# Patient Record
Sex: Male | Born: 1955 | Race: White | Hispanic: No | Marital: Married | Smoking: Never smoker
Health system: Southern US, Community
[De-identification: ages and names within clinical notes are randomized; demographics above are authoritative.]

## PROBLEM LIST (undated history)

## (undated) DIAGNOSIS — E66812 Obesity, class 2: Secondary | ICD-10-CM

## (undated) DIAGNOSIS — K219 Gastro-esophageal reflux disease without esophagitis: Secondary | ICD-10-CM

## (undated) DIAGNOSIS — G4733 Obstructive sleep apnea (adult) (pediatric): Secondary | ICD-10-CM

## (undated) DIAGNOSIS — I10 Essential (primary) hypertension: Secondary | ICD-10-CM

## (undated) DIAGNOSIS — E7800 Pure hypercholesterolemia, unspecified: Secondary | ICD-10-CM

## (undated) DIAGNOSIS — Z860101 Personal history of adenomatous and serrated colon polyps: Secondary | ICD-10-CM

## (undated) DIAGNOSIS — Z86010 Personal history of colonic polyps: Secondary | ICD-10-CM

## (undated) DIAGNOSIS — N529 Male erectile dysfunction, unspecified: Secondary | ICD-10-CM

## (undated) HISTORY — DX: Pure hypercholesterolemia, unspecified: E78.00

## (undated) HISTORY — DX: Obstructive sleep apnea (adult) (pediatric): G47.33

## (undated) HISTORY — DX: Personal history of adenomatous and serrated colon polyps: Z86.0101

## (undated) HISTORY — DX: Male erectile dysfunction, unspecified: N52.9

## (undated) HISTORY — DX: Obesity, class 2: E66.812

## (undated) HISTORY — PX: COLONOSCOPY: SHX174

## (undated) HISTORY — DX: Personal history of colonic polyps: Z86.010

## (undated) HISTORY — DX: Gastro-esophageal reflux disease without esophagitis: K21.9

## (undated) HISTORY — DX: Essential (primary) hypertension: I10

---

## 2003-06-22 DIAGNOSIS — K5792 Diverticulitis of intestine, part unspecified, without perforation or abscess without bleeding: Secondary | ICD-10-CM

## 2003-06-22 HISTORY — DX: Diverticulitis of intestine, part unspecified, without perforation or abscess without bleeding: K57.92

## 2006-06-21 HISTORY — PX: OTHER SURGICAL HISTORY: SHX169

## 2007-03-14 ENCOUNTER — Inpatient Hospital Stay (HOSPITAL_COMMUNITY): Admission: EM | Admit: 2007-03-14 | Discharge: 2007-03-18 | Payer: Self-pay | Admitting: Emergency Medicine

## 2007-04-17 ENCOUNTER — Ambulatory Visit: Payer: Self-pay | Admitting: Gastroenterology

## 2007-05-15 ENCOUNTER — Ambulatory Visit: Payer: Self-pay | Admitting: Gastroenterology

## 2007-05-25 ENCOUNTER — Inpatient Hospital Stay (HOSPITAL_COMMUNITY): Admission: RE | Admit: 2007-05-25 | Discharge: 2007-05-28 | Payer: Self-pay | Admitting: Surgery

## 2007-05-25 ENCOUNTER — Encounter (INDEPENDENT_AMBULATORY_CARE_PROVIDER_SITE_OTHER): Payer: Self-pay | Admitting: Surgery

## 2008-10-19 IMAGING — CT CT PELVIS W/ CM
2 of 5 series · 16 of 46 positions shown, 18 images · IV contrast (APPLIED)
Comparison: None

ABDOMEN CT WITH CONTRAST

CLINICAL DATA: Lower abdominal pain with nausea and fever.
TECHNIQUE: Multidetector CT imaging of the abdomen and pelvis was performed
following the standard protocol during bolus administration of intravenous
contrast.

Contrast:  100 cc Omnipaque 300

[Series 4: abd/pelv with 5.0 b31f st · axial · 0.75mm/px · z∈[-486,-56]mm · 13 of 98 slices shown, 15 images]
[im 6/98  soft-tissue]
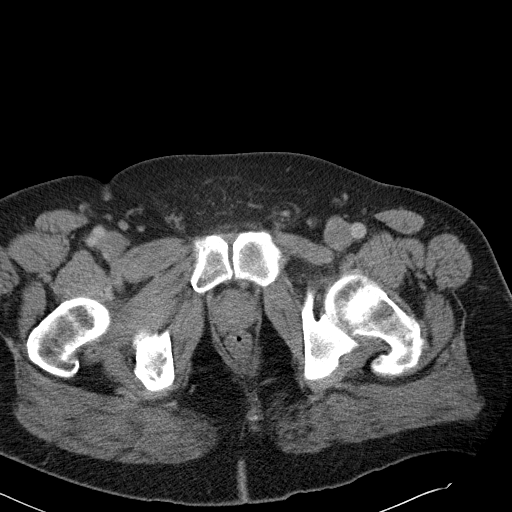
[im 6/98  bone]
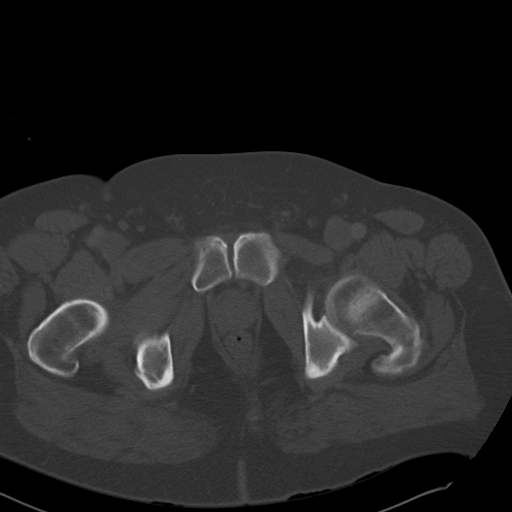
[im 11/98  soft-tissue]
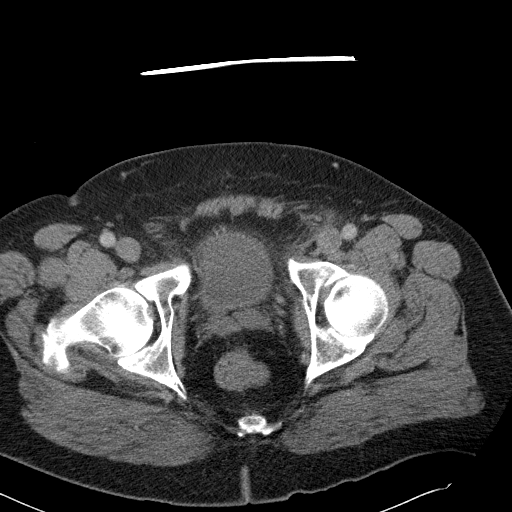
[im 22/98  soft-tissue]
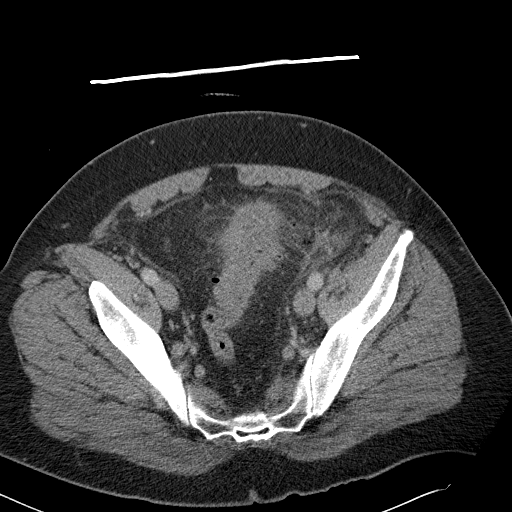
[im 27/98  soft-tissue]
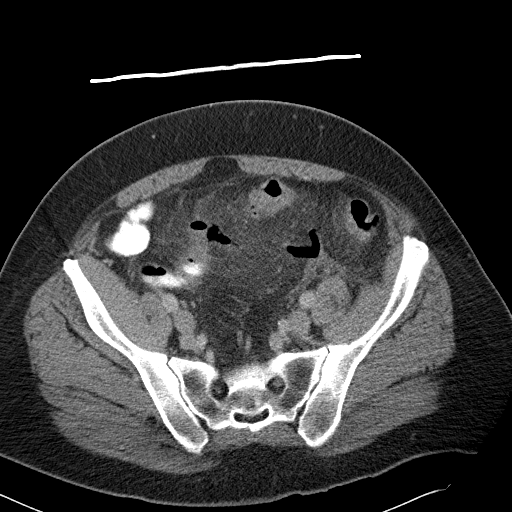
[im 33/98  soft-tissue]
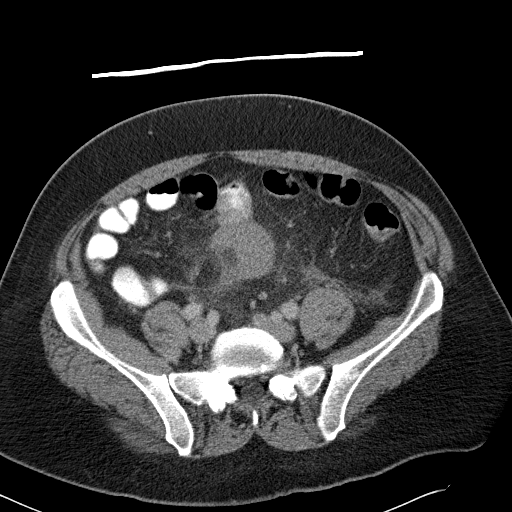
[im 44/98  soft-tissue]
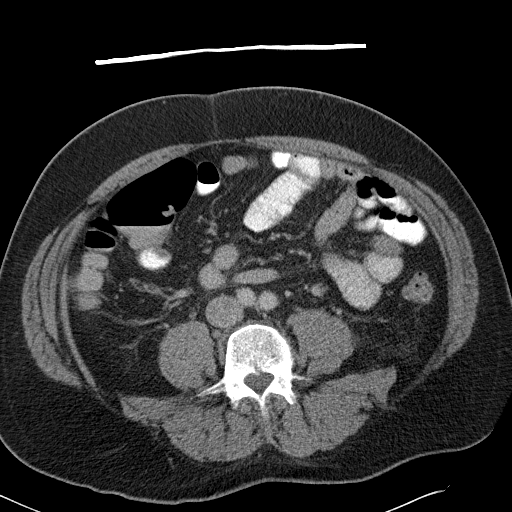
[im 49/98  soft-tissue]
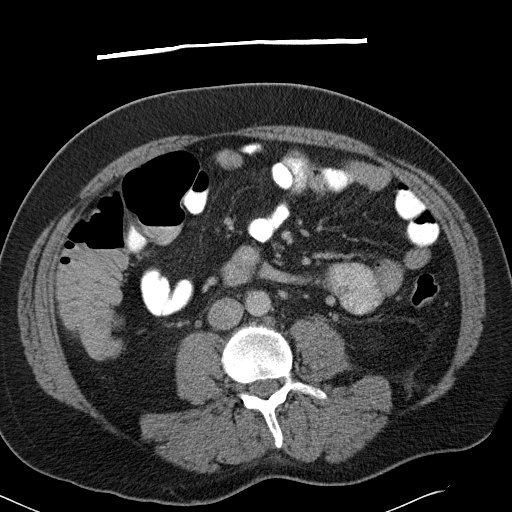
[im 54/98  soft-tissue]
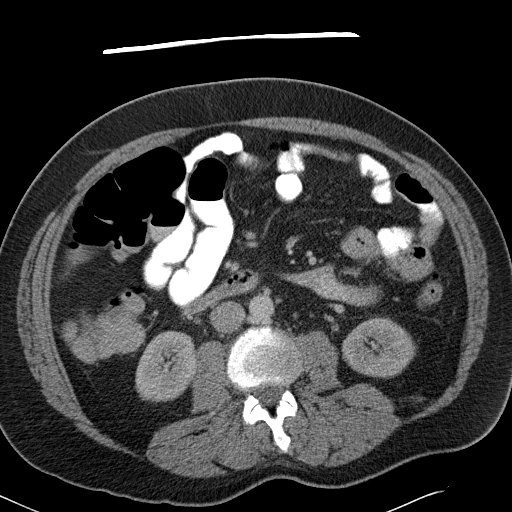
[im 65/98  soft-tissue]
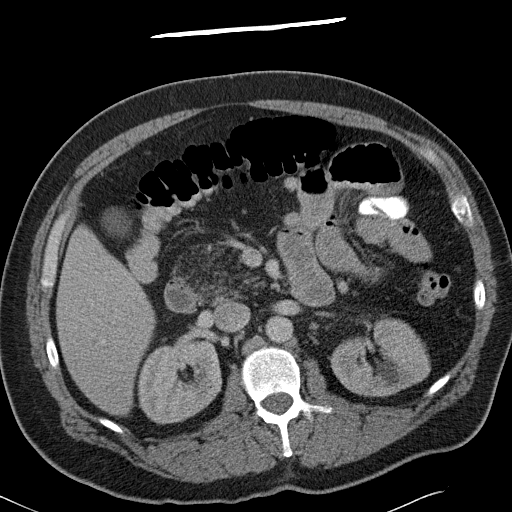
[im 65/98  bone]
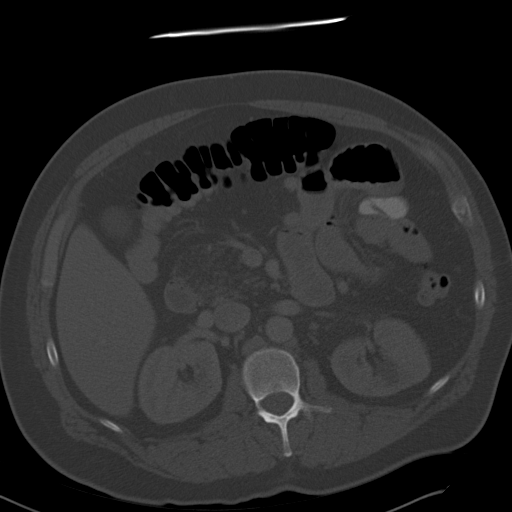
[im 71/98  soft-tissue]
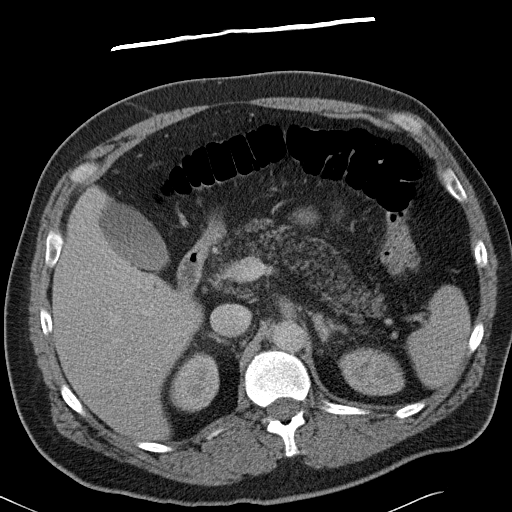
[im 76/98  soft-tissue]
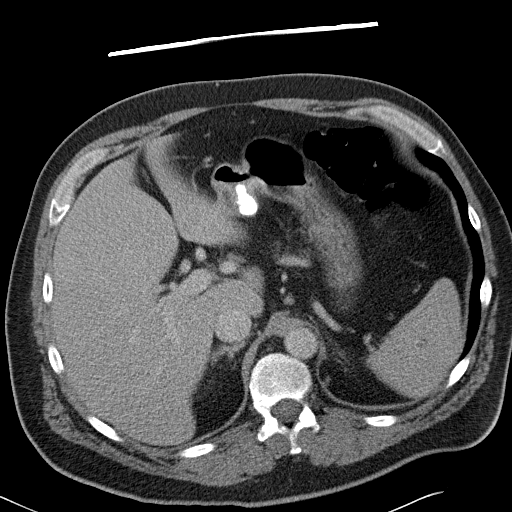
[im 87/98  soft-tissue]
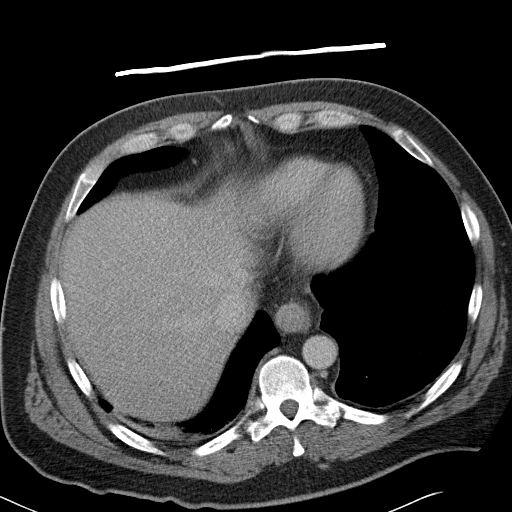
[im 92/98  soft-tissue]
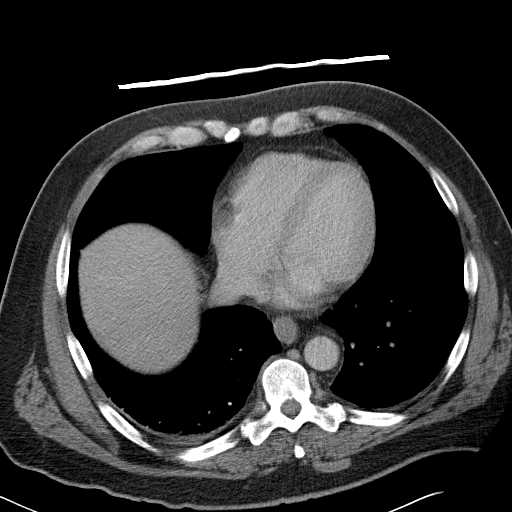

[Series 602: coronal abd · coronal · 0.95mm/px · 3 of 148 slices shown]
[im 50/148  soft-tissue]
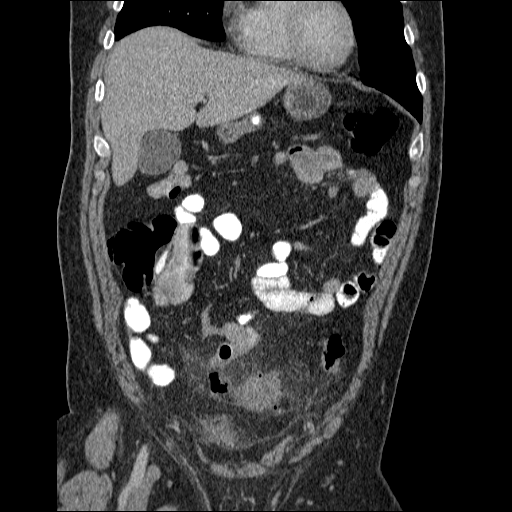
[im 66/148  soft-tissue]
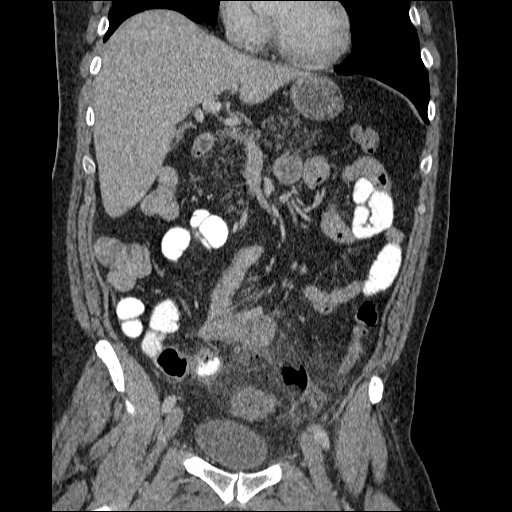
[im 82/148  soft-tissue]
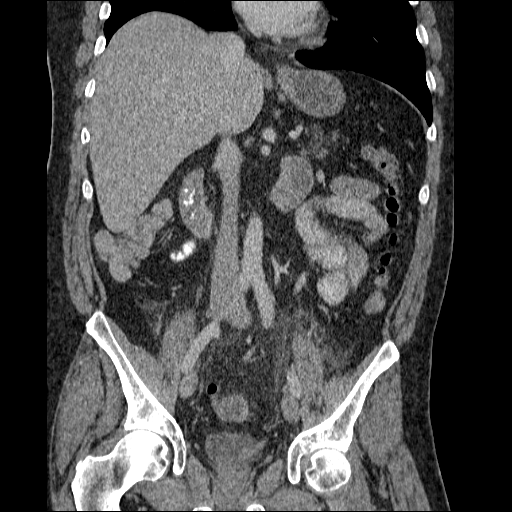

[16 of 46 positions shown; findings below may reference images not displayed]

FINDINGS: Dependent subsegmental atelectasis is present in the right lower
lobe. The left lung base appears clear.

A small hiatal hernia is present. A 3 mm hypodensity in the right hepatic lobe
on image 33 of series 4 is technically nonspecific but small size, and a similar
lesion is present on image 39 of series 4. These lesions are statistically
likely to be benign.

The spleen appears unremarkable. The adrenal glands and gallbladder appear
normal. There is prominent fatty atrophy of the pancreas. There are air-fluid
levels in several nondilated loops of small bowel in the upper abdomen, possibly
representing ileus.

Left-sided hypodense renal lesions are present and are most compatible with
cysts.

IMPRESSION

1. Small hiatal hernia.
2. Air-fluid levels in several nondilated loops of small bowel, nonspecific but
likely representing ileus.

PELVIS CT WITH CONTRAST
FINDINGS: Perforated diverticulitis is noted with scattered free
intraperitoneal gas along the inflamed sigmoid colon, with prominent surrounding
mesenteric stranding. I cannot exclude a small amount of localized portal venous
gas, although this is considered less likely. 

There is some secondary inflammation of a loop of distal ileum extending
adjacent to the extraluminal gas and inflammation, as shown on image 61 and
series 602 and image 69 series 4. In addition, there are several matted loops of
unopacified small bowel centrally just below the level of the iliac crest which
are adjacent to this inflammation and which are indistinct in appearance;
secondary inflammation of the small bowel loops is suspected. Ischemia of the
small bowel loops is difficult to exclude given the matted appearance, but
overall I believe that the source of the free intraperitoneal gas is the sigmoid
diverticulitis. The terminal ileum does appear to extend around a mesenteric
vessel or the appendix on consecutive images 55 through 59 series 4.

Inflammatory stranding extends along the left psoas muscle and into the left
inguinal region. The urinary bladder appears unremarkable.

IMPRESSION

1. Perforated sigmoid diverticulitis, with surrounding collections of free
intraperitoneal gas, and with secondary inflammation of adjacent loops of small
bowel. Given a highly indistinct appearance of the sigmoid colon, a perforated
sigmoid colon tumor cannot be totally excluded, although diverticulitis is
considered more likely.

## 2010-11-03 NOTE — Op Note (Signed)
Matthew Hess, GANGE NO.:  [id number]   MEDICAL RECORD NO.:  [id number]          PATIENT TYPE:  INP   LOCATION:  0003                         FACILITY:  [facility]   PHYSICIAN:  Ardeth Sportsman, MD     DATE OF BIRTH:  Oct 31, 1955   DATE OF PROCEDURE:  05/25/2007  DATE OF DISCHARGE:                               OPERATIVE REPORT   GASTROENTEROLOGIST:  Rachael Fee, MD   SURGEON:  Ardeth Sportsman, MD   ASSISTANT:  Clovis Pu. Cornett, MD   DIAGNOSIS:  Diverticulosis with severe sigmoid diverticular attack,  status post intravenous antibiotics.   POSTOPERATIVE DIAGNOSES:  1. Diverticulosis with severe sigmoid diverticular attack, status post      intravenous antibiotics.  2. Small bowel and appendiceal adhesions.   PROCEDURES PERFORMED:  1. Laparoscopic lysis of adhesions x60 minutes, which is about 40% the      case.  2. Laparoscopic appendectomy.  3. Laparoscopically-assisted sigmoid colectomy with 33 EEA      anastomosis.   ANESTHESIA:  1. General anesthesia.  2. Local anesthetic in a field block around port sites and extraction      site.   SPECIMENS:  1. Sigmoid colon.  Open end is proximal.  There are two anastomotic      rings.  The Prolene stitch is in the proximal ring.  2. Appendix adherent to the sigmoid colon diverticular disease.   ESTIMATED BLOOD LOSS:  250 mL.   DRAINS:  None.   COMPLICATIONS:  None apparent.   INDICATIONS:  Matthew Hess is a 55 year old male who had a severe episode  of diverticulitis with perforation that required a significant hospital  stay and IV antibiotics and long-term oral antibiotics.  He eventually  improved over a period of weeks.  Because of his significant perforation  and his relatively young age and busy lifestyle, options are discussed  including antibiotics again if re-perforation versus surgery.  The  technique of laparoscopically-assisted versus possible open sigmoid  colectomy with anastomosis  discussed.  Risks such as stroke, heart  attack, deep venous thrombosis, pulmonary embolism and death discussed.  The risks such as bleeding, transfusion, wound infection, abscess,  injury to other organs, incisional hernia, prolonged pain, anastomotic  leak resulting in need for colostomy, reoperation, ureteral injury and  other risks were discussed.  Options were discussed and the patient  wished to proceed to have a surgical approach to prevent further attacks  since this was a very severe episode and he is a very busy man that  travels and is relatively young and healthy.  The patient received a  colonoscopy by Dr. Wendall Papa, which proved no other intra-abdominal  pathologies.  Therefore, the patient wished to agree to proceed with  surgery.   OPERATIVE FINDINGS:  He had a very thickened but short segment of  sigmoid colon with a dense, thickened mesentery inflammation.  He had  dense adhesions to his anterior abdominal wall as well.  He had least  two loops of small bowel that were densely adherent to the sigmoid  mesentery and sigmoid itself.  The tip  of the appendix was densely  adherent to this area as well.  He had some moderate greater omental  adhesions as well.  The rest of the abdomen was unremarkable.   DESCRIPTION OF PROCEDURE:  Informed consent was confirmed.  The patient  had received perioperative bowel prep.  He had a preoperative alvimopan  prior to surgery.  He had sequential compression devices active during  the entire case.  He received preoperative IV Invanz.  He underwent  general anesthesia without difficulty.  He was positioned in low  lithotomy with arms tucked.  A Foley catheter was sterilely placed.  His  abdomen was clipped, prepped and draped in sterile fashion.   Entry was gained to the patient through placement of a 5-mm port in the  right upper quadrant with the patient in steep reverse Trendelenburg and  right side up using optical entry technique.   A capnoperitoneum to 15  mmHg provided good abdominal insufflation.  Under direct visualization,  5-mm ports placed in the right flank and left flank through the  umbilicus.  A 12 port was placed just right of midline in the suprapubic  region.   The findings were as noted above with some dense adhesions.  The patient  was positioned head down and left side up.  The small bowel was  reflected away except that he had some dense, adherent loops of small  bowel to the sigmoid colon.  Cold sharp dissection was used to free the  small bowel off these areas.  A careful inspection was made on the two  loops that were densely adherent and there was no evidence of serosal  tear or enterotomy.  He also had the tip of the appendix adherent to  this area.  This was densely adherent at the appendix, and I and Dr.  Luisa Hart agreed that appendectomy was warranted.  Therefore, a window was  made in the appendiceal mesentery at the base and the appendix was  stapled off, taking a cuff of normal cecum.  The appendiceal mesentery  was taken with LigaSure.   Attention was turned towards the initial mediolateral approach with the  sigmoid colon.  The superior hemorrhoidal arteries and inferior  mesenteric artery could easily be seen the mesentery since it was  already tented up.  The mesentery was scored on the right side and just  inferior to this mesentery and I was able to get into the  retromesenteric plane (Toldt's fascia).  The sigmoid mesentery was  elevated to the anterior abdominal wall and was able to free up the  retromesenteric attachment in a to the mediolateral fascia.  The iliac  arteries could be easily seen.  Initially it was hard to find the ureter  but on going up further on the pelvic brim, we could see a ureter in a  less inflamed area, I was able to follow it down, and it was kept  retroperitoneal and preserved at all times.  With the ureter identified,  I went in and took the  mesentery of the sigmoid colon about halfway  between the origin of the IMA to the sigmoid colon, sparing the left  colic artery.  It was taken using the LigaSure to the junction of the  descending/sigmoid colon to good result.  Further retromesenteric  dissection was done, carried cephalad up towards the splenic flexure and  the distal transverse colon.  There were some dense greater omental  adhesions to the lateral sidewall and these were freed  up to allow the  greater omentum to further reflect out of the way.   Mesentery was scored more distally to the right side of the rectum until  we came down to a few centimeters proximal to the peritoneal reflection  where the tinea had spread out to an obvious sigmoid-rectal junction.  The bowel wall was normal and not thickened or inflamed.  The mesentery  seemed normal as well.   The colon was medialized in a lateral to medial fashion, taking first  cold sharp scissors to help free the descending colon off the lateral  side attachments in a normal region and follow it down inferiorly down  towards the palpable reflection.  I was able to match up with the  retromesenteric dissection and identify and preserve the left ureter at  all times.  Eventually I was able to swing over and come over the pelvic  brim carefully and isolate sidewall attachments.   Given the pretty good mobilization we had, I decided to finally take the  sigmoid colon at the distal end.  A window was made at the  sigmoid/rectal junction and a window was made between the mesentery and  the proximal rectum at the sigmoid/rectal junction.  The colon was  stapled at this region using laparoscopic stapler 45 x2.  Most of it was  able to do with one firing and just needed an extra firing to get the  final 5 mm of a corner of the rectum.  The remaining rectal and distal  sigmoid mesentery was taken using the LigaSure, staying close to the  colon to avoid any retroperitoneal  injury.  With this done, we had  excellent mobilization.  There were some small bleeders on the rectal  mesentery as well as a little bit of the sigmoid mesentery, and these  were isolated and controlled with LigaSure to good result.  An  irrigation of over a liter was done with clear return.   We then decided to exteriorize.  Capnoperitoneum was evacuated through  the ports.  A 6-cm Pfannenstiel incision was made from the 12-mm  suprapubic port.  The anterior rectus fascia was opened up transversely  and subfascial planes were created superiorly and inferiorly.  The  peritoneum was entered between the linea alba.  I was able to get  through the peritoneum as well.  A wound protector was placed and the  colon was able to be easily exteriorized until we got to good, healthy  tissue at the descending/sigmoid junction.  There seemed to be plenty of  mobilization and splenic flexure mobilization was not required.  A  window was made in the normal descending colon and a soft bowel clamp  was made proximally a Kocher was made distally and the colon was  transected.  The remaining mesentery attachments were freed off using  the LigaSure, staying close to the colon to preserve as much blood  supply as possible.  A specimen of the colon was sent off.   EEA sizers were used and the colon easily allowed a 33 EEA sizer, and  therefore used a 33 EEA stapler.  Prolene 2-0 stitch was used in a  pursestring fashion to help tighten the colon around the anvil.  There  were some large epiploic appendages that were freed off using a LigaSure  and cautery, but we made sure to avoid over-skeletonization.  There had  been bleeding at the transection area, so blood supply was excellent.  Inspection was made  on the on the mesentery and hemostasis was good.  The descending colon was allowed to fall back into the abdomen.  The  wound protector was twisted off to clamp it, and capnoperitoneum was   reintroduced.   Dr. Luisa Hart went down below and did gentle finger and EEA sizer dilation  of the rectal stump.  A 33 EEA stapler easily came up the rectal stump,  and the spike was brought out through the middle of the staple line of  the rectal stump.  The anvil of the descending colon was attached onto  the colon.  There was no twisting or turning of the mesentery and it  laid well.  The staple was locked, down held for 30 seconds, fired, and  held while fired for 30 seconds and then removed.  There were two  excellent anastomotic rings and these were sent with the specimen (the  proximal end has a Prolene stitch).  Rigid proctoscopy was performed by  Dr. Luisa Hart with no bleeding within the lumen and the anastomosis was  intact.  I clamped the descending colon proximal to the anastomosis as  gentle insufflation was done under water and there was no leaking of  bubbles with good insufflation, arguing that there is no evidence of  anastomotic leak.   Copious irrigation of another 3 L was done down in the pelvis and sides  of the abdomen.  Diagnostic laparoscopy revealed no intra-abdominal  injury.  Again the small bowel and the cecal cuff and the appendiceal  mesentery were intact with no bleeding or leak.  The small bowel was  viable with no evidence of any serosal tears or leak.  Capnoperitoneum  was evacuated through the ports.  The Pfannenstiel incision was closed  in the peritoneum using a 3-0 Vicryl running stitch.  The fascia was  closed using #1 PDS in running fashion, transverse fashion.  The skin at  this incision and the port sites was closed using 4-0 Monocryl stitch.  Note that I irrigated a liter in the preperitoneal plane and then in the  subcutaneous plane and the Pfannenstiel incision each of warm saline to  help clear out the area.  Hemostasis was excellent.  Bacitracin ointment was placed over  Pfannenstiel incision and sterile dressings were applied.  The patient   was extubated and sent to recovery in stable condition.  I explained the  findings to the patient's family.  Postop instructions were discussed  and they expressed appreciation.      Ardeth Sportsman, MD  Electronically Signed     SCG/MEDQ  D:  05/25/2007  T:  05/25/2007  Job:  [id number]   cc:   Rachael Fee, MD  [address]  [place], [place] [address]

## 2010-11-03 NOTE — H&P (Signed)
NAMEDAESHON, GRAMMATICO             ACCOUNT NO.:  [id number]   MEDICAL RECORD NO.:  [id number]          PATIENT TYPE:  INP   LOCATION:  5727                         FACILITY:  [facility]   PHYSICIAN:  Ardeth Sportsman, MD     DATE OF BIRTH:  [birth year]   DATE OF ADMISSION:  03/14/2007  DATE OF DISCHARGE:                              HISTORY & PHYSICAL   PRIMARY CARE PHYSICIAN:  None   REQUESTING PHYSICIAN:  Cheri Guppy at [facility]   SURGEON:  Karie Soda for the [facility]   CHIEF COMPLAINT:  Severe abdominal pain and bloating, probable  perforated diverticulitis.   HISTORY OF PRESENT ILLNESS:  Mr. Reifsteck is a 55 year old male who has  shun doctors and otherwise claims to be in pretty good health, who noted  some severe abdominal pain that has been going on for the past few days  and it is rather generalized.  He has had some nausea, but not actually  throwing up.  He normally eats rather well, but has been having poor  appetite and been just switching over to liquids.  He was trying to ride  it out, but it continued to worsen and it got very severe last night.  He came to the emergency room a few hours ago.  Evaluation was done  concerning for perforated diverticulitis and we were asked to admit the  patient.   He has never had a colonoscopy done.  Denies any history of hematochezia  or melena or hematemesis.   PAST MEDICAL HISTORY:  Questionable obstructive sleep apnea or at least  snoring.   PAST SURGICAL HISTORY:  UP3, uvulopalatopharyngoplasty for obstructive  sleep apnea in the distant past.   ALLERGIES:  None.   MEDICATIONS:  None.   SOCIAL HISTORY:  He has never smoked.  I do like to drink my beer,  ?six-pack a beer a week.  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   FAMILY HISTORY:  Otherwise negative.   REVIEW OF SYSTEMS:  As noted per HPI.  CONSTITUTIONAL:   He has had some  chills, but no recent weight gain or weight loss.  EYES/ENT/CARDIAC/RESPIRATORY/HEPATIC/RENAL/ENDOCRINE/MUSCULOSKELETAL/NEU  ROLOGICAL/PSYCH/HEME/LYMPH/ALLERGIC:  Otherwise negative.  BREASTS:  Negative.  SKIN:  Negative.  OPHTHALMOLOGY:  Negative.   VITAL SIGNS:  He temperature is 97.4, pulse initially 103, currently 81,  respirations 18, 6/10 pain, 96% sat on room air.  GENERAL:  He is a well-developed, well-nourished overweight male talking  on his cell phone and a little anxious, but not in major distress.  PSYCH:  He is of at least average intelligence and pretty good insight  with no evidence of dementia, delirium, psychosis, paranoia.  EYES:  Pupils equal, round and reactive to light.  Extraocular movements  intact.  Sclerae not icteric or injected.  HEENT:  Normocephalic.  Mucous membranes are moist.  Nasopharynx and  oropharynx clear.  He has pretty good dentition.  NECK:  Supple.  No masses.  Trachea is midline.  HEART:  Regular rate and  rhythm.  No murmurs, gallops or rubs.  CHEST:  Clear to auscultation bilaterally.  No wheezes, rales or  rhonchi.  No pain to rib or sternal compression.  ABDOMEN:  Obese, but soft, may be slightly distended.  He is very tender  in his left lower quadrant, but the rest of his abdomen is soft and not  as particularly tender.  He has some periumbilical discomfort, but no  umbilical hernia.  GENITAL:  Normal external mal genitalia.  Testes descended bilaterally.  No inguinal hernia.  RECTAL:  Deferred per patient's request.  EXTREMITIES:  No clubbing, cyanosis or edema.  MUSCULOSKELETAL:  Full range of motion shoulders, elbows, wrists as well  as hip, knees, ankles.  NEURO:  Cranial nerves II-XII are intact.  Hand grip is 5/5, equal and  symmetrical.  No resting or intention tremors.  SKIN:  No obvious petechia material.  No other sores or lesions.   STUDIES:  Urinalysis is negative.  White count is 15.5.  He does have a   left shift.  His potassium is 3.1, creatinine is 1.16.   He does have an x-ray that shows a questionable small hiatal hernia with  some air fluid levels.   CT scan of the abdomen and pelvis shows in his mid-sigmoid colon an  inflamed loop with diverticulosis, and he has a fair amount of pockets  of air within his mesentery and some pockets of free air although it is  not massively diffuse.   ASSESSMENT/PLAN:  A 55 year old gentleman with perforated diverticulitis  that seems somewhat contained down into his pelvis with localized  peritonitis.   1. Admit.  2. IV fluids.  3. NPO.  4. IV antibiotics.  5. Serial exams.   Anatomy and physiology of the digestive tract were explained;  pathophysiology of diverticulitis with its natural history was  discussed.  Recommendations were made to admit and place on IV  antibiotics to see if this will cool down on its own; will follow up  scans and possible draining if he develops abscesses.  If he has  hemodynamically instability or worsening pain or worsening white count,  then he will require emergent colectomy with probable colostomy.  I  would like to try and see if we can get this to cool down and switch  over to oral antibiotics, do a colonoscopy in six weeks, and plan a  segmental colonic resection if there are no other surprises.   He seemed a little frustrated about having to be admitted right now, but  he was compliant with our recommendations and we will work to follow him  closely and see if he can avoid emergency surgery although I am concern  given fair amount of dots of pneumoperitoneum.      Ardeth Sportsman, MD  Electronically Signed     SCG/MEDQ  D:  03/14/2007  T:  03/14/2007  Job:  [id number]

## 2010-11-06 NOTE — Discharge Summary (Signed)
Matthew Hess, Matthew Hess             ACCOUNT NO.:  [id number]   MEDICAL RECORD NO.:  [id number]          PATIENT TYPE:  INP   LOCATION:  5704                         FACILITY:  [facility]   PHYSICIAN:  Wilmon Arms. Corliss Skains, M.D. DATE OF BIRTH:  09-21-1955   DATE OF ADMISSION:  03/14/2007  DATE OF DISCHARGE:  03/18/2007                               DISCHARGE SUMMARY   CHIEF COMPLAINT/REASON FOR ADMISSION:  Matthew Hess is a 51-year male  patient who normally does not follow up with physicians on a regular  basis, has no chronic medical problems, presented to the ER with severe  abdominal pain.  He was afebrile on presentation.  On abdominal exam he  was very tender in the left lower quadrant.  He had a white count of  15,500.  A CT scan was performed that demonstrated mid sigmoid colon  inflamed loop compatible with diverticulosis with pockets of air within  the mesentery and some free air consistent with a microperforation.  The  patient was admitted with a diagnosis of acute diverticulitis with  possible microperforation and contained peritonitis.   HOSPITAL COURSE:  The patient was admitted, placed on n.p.o. status, IV  fluid hydration and IV antibiotics/cefoxitin.  Hospital day #1, the  patient was reportedly feeling better than prior to admission except his  stomach felt bloated.  His white count had decreased to 10,500.  On exam  he was still tender in the left lower quadrant but bowel sounds were  present.  He was started on a clear liquid diet and continued on IV  antibiotics and Toradol was added for pain management.  Over the next  several days the patient continued to do well.  His white count  continued to trend down.  He remained afebrile.  His pain continued to  decrease.  CT scan was repeated on March 17, 2007.  This  demonstrated a stable contained perforation without any identifiable  drainable abscess.  The patient's diet continued to be advanced and on  March 18, 2007,  the patient was tolerating a solid diet with minimal  left lower quadrant tenderness and was deemed appropriate for discharge  home on p.o. antibiotics with plans to follow up with Dr. Michaell Cowing in the  outpatient setting.  The patient apparently has a trip planned for  [place] next week to go to work.  He had been instructed on multiple  occasions by Dr. Michaell Cowing as well as the PA student and again by Dr. Corliss Skains  about not flying given he has had a contained perforation and the change  in cabin pressure may affect this, but the patient apparently is  planning on making this trip after discharge.   FINAL DISCHARGE DIAGNOSES:  Abdominal pain and leukocytosis secondary to  acute diverticulitis with microperforation.   DISCHARGE MEDICATIONS:  1. Metoprolol 25 mg b.i.d. - this is new for new-onset hypertension.  2. Percocet 5/325 one to two tablets every 4 hours as need for pain.  3. Cipro 500 mg b.i.d. for 14 days.  4. Flagyl 500 mg t.i.d. for 14 days.  5. Over-the-counter ibuprofen two to three  tablets every 8 hours as      needed for pain.  Please take with food or snack.   RETURN TO WORK:  The patient is self-employed.   DIET:  Low-sodium heart-healthy.   WOUND CARE:  Not applicable.   ACTIVITY:  Increase activity slowly.  May walk up steps.  May shower.  No driving while taking the Percocet.   OTHER INSTRUCTIONS:  Do not fly for the next 2 weeks.  Also, you are to  call the doctor if (a) fever greater than or equal to 100 degrees  Fahrenheit; (b) new or increased belly pain; (c) diarrhea, nausea or  vomiting; or (d) inability to pass stool.      Allison L. Rondel Jumbo. Tsuei, M.D.  Electronically Signed    ALE/MEDQ  D:  04/27/2007  T:  04/28/2007  Job:  [id number]   cc:   Ardeth Sportsman, MD

## 2010-11-06 NOTE — Discharge Summary (Signed)
Matthew Hess, Matthew Hess             ACCOUNT NO.:  [id number]   MEDICAL RECORD NO.:  [id number]          PATIENT TYPE:  INP   LOCATION:  1534                         FACILITY:  [facility]   PHYSICIAN:  Ardeth Sportsman, MD     DATE OF BIRTH:  23-Dec-1955   DATE OF ADMISSION:  05/25/2007  DATE OF DISCHARGE:  05/28/2007                               DISCHARGE SUMMARY   FINAL DIAGNOSES:  Recurrent diverticulitis with diverticulosis.   PROCEDURE PERFORMED:  Laparoscopic lyses of adhesions, appendectomy and  sigmoid colectomy with anastomosis on May 25, 2007.   HOSPITAL COURSE:  Matthew Hess is a 55 year old gentleman who had severe  episode of diverticulitis with perforation and recurrent pain who was  felt to benefit from resection.  This was done laparoscopically on  May 25, 2007.  He was placed on oral enteric.  He began to have  flatus and by the time of discharge was tolerating a solid diet.  He had  a bowel movement.  His pain was well-controlled on oral medications.  He  was walking in the hallways.  The patient is improved slightly and felt  to be safe for discharge on postop day three 3 with the following  instructions:  1. He is return to clinic to see me in a couple weeks.  2. He is to follow up on pathology.  3. He should call if he has any fevers, chills or sweats, nausea,      vomiting, drainage from incision, uncontrolled pain or other      issues.  4. He should be discharged with Percocet 1-2 p.o. q.4h. p.r.n. pain,      ibuprofen 400-800 mg p.o. q.6h. p.r.n. pain, ice packs or heating      pads p.r.n. pain, Tylenol p.r.n. pain.      Ardeth Sportsman, MD  Electronically Signed     SCG/MEDQ  D:  06/23/2007  T:  06/23/2007  Job:  [id number]   cc:   Rachael Fee, MD  [address]  [place], [place] [address]

## 2011-03-29 LAB — CBC
HCT: 42.8
Hemoglobin: 14.4
MCHC: 33.6
Platelets: 376
RDW: 14.5

## 2011-03-29 LAB — BASIC METABOLIC PANEL
BUN: 5 — ABNORMAL LOW
CO2: 27
Calcium: 9.1
GFR calc non Af Amer: >60
Glucose, Bld: 95
Potassium: 4.1

## 2011-04-01 LAB — CBC
HCT: 32.6 — ABNORMAL LOW
HCT: 32.8 — ABNORMAL LOW
HCT: 37.8 — ABNORMAL LOW
Hemoglobin: 10.9 — ABNORMAL LOW
Hemoglobin: 11 — ABNORMAL LOW
Hemoglobin: 12.7 — ABNORMAL LOW
MCHC: 33.6
MCV: 86.4
MCV: 86.4
MCV: 86.5
Platelets: 292
Platelets: 341
RBC: 4.37
RDW: 13.2
RDW: 13.2
RDW: 13.4
WBC: 6.4
WBC: 8.1

## 2011-04-01 LAB — DIFFERENTIAL
Basophils Absolute: 0
Basophils Absolute: 0.1
Basophils Relative: 0
Eosinophils Relative: 0
Eosinophils Relative: 3
Lymphocytes Relative: 17
Lymphocytes Relative: 22
Lymphs Abs: 1.4
Lymphs Abs: 1.4
Monocytes Absolute: 0.8 — ABNORMAL HIGH
Monocytes Absolute: 0.8 — ABNORMAL HIGH
Monocytes Relative: 12 — ABNORMAL HIGH
Monocytes Relative: 5
Neutro Abs: 13.6 — ABNORMAL HIGH
Neutro Abs: 5.8

## 2011-04-01 LAB — BASIC METABOLIC PANEL
BUN: 12
CO2: 24
Calcium: 8.2 — ABNORMAL LOW
Chloride: 101
Creatinine, Ser: 0.85
GFR calc Af Amer: >60
GFR calc non Af Amer: >60
Glucose, Bld: 124 — ABNORMAL HIGH
Glucose, Bld: 145 — ABNORMAL HIGH
Potassium: 3.1 — ABNORMAL LOW
Potassium: 3.6
Sodium: 133 — ABNORMAL LOW

## 2011-04-01 LAB — URINALYSIS, ROUTINE W REFLEX MICROSCOPIC
Nitrite: NEGATIVE
Protein, ur: 100 — AB
Specific Gravity, Urine: 1.022
Urobilinogen, UA: 1

## 2011-04-01 LAB — URINE MICROSCOPIC-ADD ON

## 2012-04-18 ENCOUNTER — Encounter: Payer: Self-pay | Admitting: Gastroenterology

## 2012-10-11 ENCOUNTER — Encounter: Payer: Self-pay | Admitting: Gastroenterology

## 2017-01-17 ENCOUNTER — Ambulatory Visit: Payer: Self-pay | Attending: Family Medicine | Admitting: Family Medicine

## 2017-01-17 ENCOUNTER — Encounter: Payer: Self-pay | Admitting: Family Medicine

## 2017-01-17 VITALS — BP 112/67 | HR 65 | Temp 98.0°F | Resp 18 | Ht 72.0 in | Wt 231.2 lb

## 2017-01-17 DIAGNOSIS — Z800 Family history of malignant neoplasm of digestive organs: Secondary | ICD-10-CM | POA: Insufficient documentation

## 2017-01-17 DIAGNOSIS — Z1211 Encounter for screening for malignant neoplasm of colon: Secondary | ICD-10-CM

## 2017-01-17 DIAGNOSIS — Z7689 Persons encountering health services in other specified circumstances: Secondary | ICD-10-CM | POA: Insufficient documentation

## 2017-01-17 NOTE — Progress Notes (Signed)
Patient is here for establish care   Patient is requesting referral to colonoscopy

## 2017-01-17 NOTE — Patient Instructions (Signed)
Colonoscopy, Adult A colonoscopy is an exam to look at the entire large intestine. During the exam, a lubricated, bendable tube is inserted into the anus and then passed into the rectum, colon, and other parts of the large intestine. A colonoscopy is often done as a part of normal colorectal screening or in response to certain symptoms, such as anemia, persistent diarrhea, abdominal pain, and blood in the stool. The exam can help screen for and diagnose medical problems, including:  Tumors.  Polyps.  Inflammation.  Areas of bleeding.  Tell a health care provider about:  Any allergies you have.  All medicines you are taking, including vitamins, herbs, eye drops, creams, and over-the-counter medicines.  Any problems you or family members have had with anesthetic medicines.  Any blood disorders you have.  Any surgeries you have had.  Any medical conditions you have.  Any problems you have had passing stool. What are the risks? Generally, this is a safe procedure. However, problems may occur, including:  Bleeding.  A tear in the intestine.  A reaction to medicines given during the exam.  Infection (rare).  What happens before the procedure? Eating and drinking restrictions Follow instructions from your health care provider about eating and drinking, which may include:  A few days before the procedure - follow a low-fiber diet. Avoid nuts, seeds, dried fruit, raw fruits, and vegetables.  1-3 days before the procedure - follow a clear liquid diet. Drink only clear liquids, such as clear broth or bouillon, black coffee or tea, clear juice, clear soft drinks or sports drinks, gelatin dessert, and popsicles. Avoid any liquids that contain red or purple dye.  On the day of the procedure - do not eat or drink anything during the 2 hours before the procedure, or within the time period that your health care provider recommends.  Bowel prep If you were prescribed an oral bowel prep  to clean out your colon:  Take it as told by your health care provider. Starting the day before your procedure, you will need to drink a large amount of medicated liquid. The liquid will cause you to have multiple loose stools until your stool is almost clear or light green.  If your skin or anus gets irritated from diarrhea, you may use these to relieve the irritation: ? Medicated wipes, such as adult wet wipes with aloe and vitamin E. ? A skin soothing-product like petroleum jelly.  If you vomit while drinking the bowel prep, take a break for up to 60 minutes and then begin the bowel prep again. If vomiting continues and you cannot take the bowel prep without vomiting, call your health care provider.  General instructions  Ask your health care provider about changing or stopping your regular medicines. This is especially important if you are taking diabetes medicines or blood thinners.  Plan to have someone take you home from the hospital or clinic. What happens during the procedure?  An IV tube may be inserted into one of your veins.  You will be given medicine to help you relax (sedative).  To reduce your risk of infection: ? Your health care team will wash or sanitize their hands. ? Your anal area will be washed with soap.  You will be asked to lie on your side with your knees bent.  Your health care provider will lubricate a long, thin, flexible tube. The tube will have a camera and a light on the end.  The tube will be inserted into your   anus.  The tube will be gently eased through your rectum and colon.  Air will be delivered into your colon to keep it open. You may feel some pressure or cramping.  The camera will be used to take images during the procedure.  A small tissue sample may be removed from your body to be examined under a microscope (biopsy). If any potential problems are found, the tissue will be sent to a lab for testing.  If small polyps are found, your  health care provider may remove them and have them checked for cancer cells.  The tube that was inserted into your anus will be slowly removed. The procedure may vary among health care providers and hospitals. What happens after the procedure?  Your blood pressure, heart rate, breathing rate, and blood oxygen level will be monitored until the medicines you were given have worn off.  Do not drive for 24 hours after the exam.  You may have a small amount of blood in your stool.  You may pass gas and have mild abdominal cramping or bloating due to the air that was used to inflate your colon during the exam.  It is up to you to get the results of your procedure. Ask your health care provider, or the department performing the procedure, when your results will be ready. This information is not intended to replace advice given to you by your health care provider. Make sure you discuss any questions you have with your health care provider. Document Released: 06/04/2000 Document Revised: 04/07/2016 Document Reviewed: 08/19/2015 Elsevier Interactive Patient Education  2018 Elsevier Inc.  

## 2017-01-17 NOTE — Progress Notes (Signed)
   Subjective:  Patient ID: Matthew Hess Schachter, male    DOB: 08/09/1955  Age: 61 y.o. MRN: [id number]  CC: Establish Care   HPI Matthew Hess [place] presents to establish care. He is requesting referral for colonoscopy. He reports last colonoscopy was in 2008. Family history of colon cancer- Father. According to colonoscopy findings it showed diverticulosis, schedule colon resection procedure in 1 to 2 weeks,  and recommended repeat colonoscopy in 5 years. He denies any melena, hematochezia, abdominal pain, constipation, hemorrhoids, or N/V.    No outpatient prescriptions prior to visit.   No facility-administered medications prior to visit.     ROS Review of Systems  Constitutional: Negative.   HENT: Negative.   Eyes: Negative.   Respiratory: Negative.   Cardiovascular: Negative.   Gastrointestinal: Negative.   Skin: Negative.         Objective:  BP 112/67 (BP Location: Left Arm, Patient Position: Sitting, Cuff Size: Normal)   Pulse 65   Temp 98 F (36.7 C) (Oral)   Resp 18   Ht 6' (1.829 m)   Wt 231 lb 3.2 oz (104.9 kg)   SpO2 97%   BMI 31.36 kg/m   BP/Weight 01/17/2017  Systolic BP 112  Diastolic BP 67  Wt. (Lbs) 231.2  BMI 31.36     Physical Exam  Constitutional: He appears well-developed and well-nourished.  HENT:  Head: Normocephalic and atraumatic.  Right Ear: External ear normal.  Left Ear: External ear normal.  Nose: Nose normal.  Mouth/Throat: Oropharynx is clear and moist.  Eyes: Pupils are equal, round, and reactive to light. Conjunctivae are normal.  Neck: No JVD present.  Cardiovascular: Normal rate, regular rhythm, normal heart sounds and intact distal pulses.   Pulmonary/Chest: Effort normal and breath sounds normal.  Abdominal: Soft. Bowel sounds are normal. There is no tenderness.  Skin: Skin is warm and dry.  Nursing note and vitals reviewed.    Assessment & Plan:   Problem List Items Addressed This Visit    None    Visit Diagnoses    Encounter to establish care    -  Primary   Recommend patient schedule appointment for annual physical. He declines at this time.   Relevant Orders   Ambulatory referral to Gastroenterology   Screening for colon cancer       Relevant Orders   Ambulatory referral to Gastroenterology      No orders of the defined types were placed in this encounter.   Follow-up: No Follow-up on file.   Lizbeth BarkMandesia R Malaka Ruffner FNP

## 2021-04-13 LAB — HM COLONOSCOPY

## 2021-04-16 DIAGNOSIS — Z860100 Personal history of colon polyps, unspecified: Secondary | ICD-10-CM | POA: Insufficient documentation

## 2021-06-21 DIAGNOSIS — E785 Hyperlipidemia, unspecified: Secondary | ICD-10-CM

## 2021-06-21 HISTORY — DX: Hyperlipidemia, unspecified: E78.5

## 2021-06-24 ENCOUNTER — Encounter: Payer: Self-pay | Admitting: Family Medicine

## 2021-06-26 ENCOUNTER — Other Ambulatory Visit: Payer: Self-pay

## 2021-06-29 ENCOUNTER — Encounter: Payer: Self-pay | Admitting: Family Medicine

## 2021-06-29 ENCOUNTER — Ambulatory Visit (INDEPENDENT_AMBULATORY_CARE_PROVIDER_SITE_OTHER): Payer: Medicare HMO | Admitting: Family Medicine

## 2021-06-29 ENCOUNTER — Other Ambulatory Visit: Payer: Self-pay

## 2021-06-29 VITALS — BP 130/82 | HR 59 | Temp 97.8°F | Ht 71.75 in | Wt 252.0 lb

## 2021-06-29 DIAGNOSIS — Z23 Encounter for immunization: Secondary | ICD-10-CM

## 2021-06-29 DIAGNOSIS — Z9989 Dependence on other enabling machines and devices: Secondary | ICD-10-CM | POA: Diagnosis not present

## 2021-06-29 DIAGNOSIS — G4733 Obstructive sleep apnea (adult) (pediatric): Secondary | ICD-10-CM | POA: Diagnosis not present

## 2021-06-29 DIAGNOSIS — Z125 Encounter for screening for malignant neoplasm of prostate: Secondary | ICD-10-CM

## 2021-06-29 DIAGNOSIS — Z0000 Encounter for general adult medical examination without abnormal findings: Secondary | ICD-10-CM

## 2021-06-29 DIAGNOSIS — I10 Essential (primary) hypertension: Secondary | ICD-10-CM

## 2021-06-29 MED ORDER — SILDENAFIL CITRATE 20 MG PO TABS: 20.0000 mg | ORAL_TABLET | Freq: Three times a day (TID) | ORAL | 3 refills | Status: DC

## 2021-06-29 NOTE — Addendum Note (Signed)
Addended by: Deveron Furlong D on: 06/29/2021 02:37 PM   Modules accepted: Orders

## 2021-06-29 NOTE — Patient Instructions (Addendum)
Check blood pressure and heart rate a few times a week and write these numbers down for review with me at next visit in 3 months.  Your goal blood pressure is 130 on top and 80 on bottom. Call or return if your numbers are consistently higher than this.   DASH Eating Plan DASH stands for Dietary Approaches to Stop Hypertension. The DASH eating plan is a healthy eating plan that has been shown to: Reduce high blood pressure (hypertension). Reduce your risk for type 2 diabetes, heart disease, and stroke. Help with weight loss. What are tips for following this plan? Reading food labels Check food labels for the amount of salt (sodium) per serving. Choose foods with less than 5 percent of the Daily Value of sodium. Generally, foods with less than 300 milligrams (mg) of sodium per serving fit into this eating plan. To find whole grains, look for the word "whole" as the first word in the ingredient list. Shopping Buy products labeled as "low-sodium" or "no salt added." Buy fresh foods. Avoid canned foods and pre-made or frozen meals. Cooking Avoid adding salt when cooking. Use salt-free seasonings or herbs instead of table salt or sea salt. Check with your health care provider or pharmacist before using salt substitutes. Do not fry foods. Cook foods using healthy methods such as baking, boiling, grilling, roasting, and broiling instead. Cook with heart-healthy oils, such as olive, canola, avocado, soybean, or sunflower oil. Meal planning  Eat a balanced diet that includes: 4 or more servings of fruits and 4 or more servings of vegetables each day. Try to fill one-half of your plate with fruits and vegetables. 6-8 servings of whole grains each day. Less than 6 oz (170 g) of lean meat, poultry, or fish each day. A 3-oz (85-g) serving of meat is about the same size as a deck of cards. One egg equals 1 oz (28 g). 2-3 servings of low-fat dairy each day. One serving is 1 cup (237 mL). 1 serving of  nuts, seeds, or beans 5 times each week. 2-3 servings of heart-healthy fats. Healthy fats called omega-3 fatty acids are found in foods such as walnuts, flaxseeds, fortified milks, and eggs. These fats are also found in cold-water fish, such as sardines, salmon, and mackerel. Limit how much you eat of: Canned or prepackaged foods. Food that is high in trans fat, such as some fried foods. Food that is high in saturated fat, such as fatty meat. Desserts and other sweets, sugary drinks, and other foods with added sugar. Full-fat dairy products. Do not salt foods before eating. Do not eat more than 4 egg yolks a week. Try to eat at least 2 vegetarian meals a week. Eat more home-cooked food and less restaurant, buffet, and fast food. Lifestyle When eating at a restaurant, ask that your food be prepared with less salt or no salt, if possible. If you drink alcohol: Limit how much you use to: 0-1 drink a day for women who are not pregnant. 0-2 drinks a day for men. Be aware of how much alcohol is in your drink. In the [place] one drink equals one 12 oz bottle of beer (355 mL), one 5 oz glass of wine (148 mL), or one 1 oz glass of hard liquor (44 mL). General information Avoid eating more than 2,300 mg of salt a day. If you have hypertension, you may need to reduce your sodium intake to 1,500 mg a day. Work with your health care provider to maintain a  healthy body weight or to lose weight. Ask what an ideal weight is for you. Get at least 30 minutes of exercise that causes your heart to beat faster (aerobic exercise) most days of the week. Activities may include walking, swimming, or biking. Work with your health care provider or dietitian to adjust your eating plan to your individual calorie needs. What foods should I eat? Fruits All fresh, dried, or frozen fruit. Canned fruit in natural juice (without added sugar). Vegetables Fresh or frozen vegetables (raw, steamed, roasted, or grilled).  Low-sodium or reduced-sodium tomato and vegetable juice. Low-sodium or reduced-sodium tomato sauce and tomato paste. Low-sodium or reduced-sodium canned vegetables. Grains Whole-grain or whole-wheat bread. Whole-grain or whole-wheat pasta. Brown rice. Orpah Cobbatmeal. Quinoa. Bulgur. Whole-grain and low-sodium cereals. Pita bread. Low-fat, low-sodium crackers. Whole-wheat flour tortillas. Meats and other proteins Skinless chicken or Malawiturkey. Ground chicken or Mal[place]iturkey. Pork with fat trimmed off. Fish and seafood. Egg whites. Dried beans, peas, or lentils. Unsalted nuts, nut butters, and seeds. Unsalted canned beans. Lean cuts of beef with fat trimmed off. Low-sodium, lean precooked or cured meat, such as sausages or meat loaves. Dairy Low-fat (1%) or fat-free (skim) milk. Reduced-fat, low-fat, or fat-free cheeses. Nonfat, low-sodium ricotta or cottage cheese. Low-fat or nonfat yogurt. Low-fat, low-sodium cheese. Fats and oils Soft margarine without trans fats. Vegetable oil. Reduced-fat, low-fat, or light mayonnaise and salad dressings (reduced-sodium). Canola, safflower, olive, avocado, soybean, and sunflower oils. Avocado. Seasonings and condiments Herbs. Spices. Seasoning mixes without salt. Other foods Unsalted popcorn and pretzels. Fat-free sweets. The items listed above may not be a complete list of foods and beverages you can eat. Contact a dietitian for more information. What foods should I avoid? Fruits Canned fruit in a light or heavy syrup. Fried fruit. Fruit in cream or butter sauce. Vegetables Creamed or fried vegetables. Vegetables in a cheese sauce. Regular canned vegetables (not low-sodium or reduced-sodium). Regular canned tomato sauce and paste (not low-sodium or reduced-sodium). Regular tomato and vegetable juice (not low-sodium or reduced-sodium). Rosita FirePickles. Olives. Grains Baked goods made with fat, such as croissants, muffins, or some breads. Dry pasta or rice meal packs. Meats and other  proteins Fatty cuts of meat. Ribs. Fried meat. Tomasa BlaseBacon. Bologna, salami, and other precooked or cured meats, such as sausages or meat loaves. Fat from the back of a pig (fatback). Bratwurst. Salted nuts and seeds. Canned beans with added salt. Canned or smoked fish. Whole eggs or egg yolks. Chicken or Malaw[place]urkey with skin. Dairy Whole or 2% milk, cream, and half-and-half. Whole or full-fat cream cheese. Whole-fat or sweetened yogurt. Full-fat cheese. Nondairy creamers. Whipped toppings. Processed cheese and cheese spreads. Fats and oils Butter. Stick margarine. Lard. Shortening. Ghee. Bacon fat. Tropical oils, such as coconut, palm kernel, or palm oil. Seasonings and condiments Onion salt, garlic salt, seasoned salt, table salt, and sea salt. Worcestershire sauce. Tartar sauce. Barbecue sauce. Teriyaki sauce. Soy sauce, including reduced-sodium. Steak sauce. Canned and packaged gravies. Fish sauce. Oyster sauce. Cocktail sauce. Store-bought horseradish. Ketchup. Mustard. Meat flavorings and tenderizers. Bouillon cubes. Hot sauces. Pre-made or packaged marinades. Pre-made or packaged taco seasonings. Relishes. Regular salad dressings. Other foods Salted popcorn and pretzels. The items listed above may not be a complete list of foods and beverages you should avoid. Contact a dietitian for more information. Where to find more information National Heart, Lung, and Blood Institute: PopSteam.iswww.nhlbi.nih.gov American Heart Association: www.heart.org Academy of Nutrition and Dietetics: www.eatright.org National Kidney Foundation: www.kidney.org Summary The DASH eating plan is a healthy  eating plan that has been shown to reduce high blood pressure (hypertension). It may also reduce your risk for type 2 diabetes, heart disease, and stroke. When on the DASH eating plan, aim to eat more fresh fruits and vegetables, whole grains, lean proteins, low-fat dairy, and heart-healthy fats. With the DASH eating plan, you should  limit salt (sodium) intake to 2,300 mg a day. If you have hypertension, you may need to reduce your sodium intake to 1,500 mg a day. Work with your health care provider or dietitian to adjust your eating plan to your individual calorie needs. This information is not intended to replace advice given to you by your health care provider. Make sure you discuss any questions you have with your health care provider. Document Revised: 05/11/2019 Document Reviewed: 05/11/2019 Elsevier Patient Education  2022 [employer].

## 2021-06-29 NOTE — Progress Notes (Signed)
Office Note 06/29/2021  CC:  Chief Complaint  Patient presents with   Establish Care    Would like to discuss BP, wife checks BP at home once a week.    HPI:  Matthew Hess is a 66 y.o. male who is here to establish care and discuss elevated blood pressure and for health maintenance exam. Patient's most recent primary MD: none Old records were reviewed prior to or during today's visit.  Patient reports feeling well. He has had some high blood pressures in the past, has never been on medication. His wife has been checking his blood pressure and noting top 160s consistently up until 2 weeks ago.  About 2 weeks ago he started using a CPAP machine that he purchased.  He has never been on CPAP before, never had insurance to see a sleep specialist and get ongoing care for this.  He did have a sleep study which documented sleep apnea in the remote past though. He has not had a chance to check his blood pressure at home since starting his sleep apnea machine.  He sleeps through the night with it and does feel better on it.  He asks for a medication for erectile dysfunction today.  Reports about a 5-year history of decreased erections.  It is causing some issues in the bedroom with his wife.  He has used some Viagra that he got off the website and says this has helped significantly.    Past Medical History:  Diagnosis Date   Diverticulitis 2005   recurrent->segmental colectomy   GERD (gastroesophageal reflux disease)    History of adenomatous polyp of colon    Hypertension    OSA (obstructive sleep apnea)     Past Surgical History:  Procedure Laterality Date   COLONOSCOPY     04/2007.  Rpt 2022-[facility]->polypectomy, recall 03/2024.   Segmental colectomy  2008   d/t recurrent diverticulitis   TONSILLECTOMY AND ADENOIDECTOMY  2000   and uvulectomy    Family History  Problem Relation Age of Onset   Arthritis Mother    Hearing loss Mother    Diabetes Mother    Cancer Father     Depression Father    Mental illness Father    Arthritis Sister    Alcohol abuse Maternal Grandmother    Diabetes Paternal Grandmother    Alcohol abuse Paternal Grandfather    Mental illness Paternal Grandfather    Alcohol abuse Daughter    Depression Daughter     Social History   Socioeconomic History   Marital status: Married    Spouse name: Not on file   Number of children: Not on file   Years of education: Not on file   Highest education level: Not on file  Occupational History   Not on file  Tobacco Use   Smoking status: Never   Smokeless tobacco: Never  Substance and Sexual Activity   Alcohol use: Yes    Comment: occasionly    Drug use: Not on file   Sexual activity: Not Currently  Other Topics Concern   Not on file  Social History Narrative   Married, 2 children.   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   No tob.   Alcohol-->hx of liquor but quit this, ongoing beer about a case per week as of 06/2021.   Social Determinants of Health   Financial Resource Strain: Not on file  Food Insecurity: Not on file  Transportation Needs: Not on file  Physical Activity: Not on  file  Stress: Not on file  Social Connections: Not on file  Intimate Partner Violence: Not on file    Outpatient Encounter Medications as of 06/29/2021  Medication Sig   Multiple Vitamin (ONE-A-DAY MENS PO) Take by mouth daily.   sildenafil (REVATIO) 20 MG tablet Take 1 tablet (20 mg total) by mouth 3 (three) times daily.   No facility-administered encounter medications on file as of 06/29/2021.    No Known Allergies  Review of Systems  Constitutional:  Negative for appetite change, chills, fatigue and fever.  HENT:  Negative for congestion, dental problem, ear pain and sore throat.   Eyes:  Negative for discharge, redness and visual disturbance.  Respiratory:  Negative for cough, chest tightness, shortness of breath and wheezing.   Cardiovascular:  Negative for chest pain,  palpitations and leg swelling.  Gastrointestinal:  Negative for abdominal pain, blood in stool, diarrhea, nausea and vomiting.  Genitourinary:  Negative for difficulty urinating, dysuria, flank pain, frequency, hematuria and urgency.  Musculoskeletal:  Negative for arthralgias, back pain, joint swelling, myalgias and neck stiffness.  Skin:  Negative for pallor and rash.  Neurological:  Negative for dizziness, speech difficulty, weakness and headaches.  Endo/Heme/Allergies:  Negative for adenopathy. Does not bruise/bleed easily.  Psychiatric/Behavioral:  Negative for confusion and sleep disturbance. The patient is not nervous/anxious.   Review of Systems  Constitutional:  Negative for appetite change, chills, fatigue and fever.  HENT:  Negative for congestion, dental problem, ear pain and sore throat.   Eyes:  Negative for discharge, redness and visual disturbance.  Respiratory:  Negative for cough, chest tightness, shortness of breath and wheezing.   Cardiovascular:  Negative for chest pain, palpitations and leg swelling.  Gastrointestinal:  Negative for abdominal pain, blood in stool, diarrhea, nausea and vomiting.  Genitourinary:  Negative for difficulty urinating, dysuria, flank pain, frequency, hematuria and urgency.  Musculoskeletal:  Negative for arthralgias, back pain, joint swelling, myalgias and neck stiffness.  Skin:  Negative for pallor and rash.  Neurological:  Negative for dizziness, speech difficulty, weakness and headaches.  Hematological:  Negative for adenopathy. Does not bruise/bleed easily.  Psychiatric/Behavioral:  Negative for confusion and sleep disturbance. The patient is not nervous/anxious.    PE; Blood pressure 130/82, pulse (!) 59, temperature 97.8 F (36.6 C), temperature source Oral, height 5' 11.75" (1.822 m), weight 252 lb (114.3 kg), SpO2 98 %.Body mass index is 34.42 kg/m.  Physical Exam  Gen: Alert, well appearing.  Patient is oriented to person, place,  time, and situation. AFFECT: pleasant, lucid thought and speech. ENT: Ears: EACs clear, normal epithelium.  TMs with good light reflex and landmarks bilaterally.  Eyes: no injection, icteris, swelling, or exudate.  EOMI, PERRLA. Nose: no drainage or turbinate edema/swelling.  No injection or focal lesion.  Mouth: lips without lesion/swelling.  Oral mucosa pink and moist.  Dentition intact and without obvious caries or gingival swelling.  Oropharynx without erythema, exudate, or swelling.  Neck: supple/nontender.  No LAD, mass, or TM.  Carotid pulses 2+ bilaterally, without bruits. CV: RRR, no m/r/g.   LUNGS: CTA bilat, nonlabored resps, good aeration in all lung fields. ABD: soft, NT, ND, BS normal.  No hepatospenomegaly or mass.  No bruits. EXT: no clubbing, cyanosis, or edema.  Musculoskeletal: no joint swelling, erythema, warmth, or tenderness.  ROM of all joints intact. Skin - no sores or suspicious lesions or rashes or color changes  Pertinent labs:  No results found for: TSH Lab Results  Component Value Date  WBC 5.6 05/23/2007   HGB 14.4 05/23/2007   HCT 42.8 05/23/2007   MCV 84.7 05/23/2007   PLT 376 05/23/2007   Lab Results  Component Value Date   CREATININE 0.86 05/23/2007   BUN 5 (L) 05/23/2007   NA 141 05/23/2007   K 4.1 05/23/2007   CL 105 05/23/2007   CO2 27 05/23/2007    ASSESSMENT AND PLAN:   #1 hypertension. Unclear whether this was all secondary to untreated sleep apnea.  Now that he is on CPAP will have him monitor blood pressure regularly at home, discussed goal 130/80 or better.  DASH diet handout given today.  Checking electrolytes and creatinine as well as CBC and lipid panel today.  2 obstructive sleep apnea.  Feeling better since h bought a CPAP machine.  I do not think it is necessary that he see sleep specialist at this point.  Possibly having do this in the future.  3) Health maintenance exam: Reviewed age and gender appropriate health maintenance  issues (prudent diet, regular exercise, health risks of tobacco and excessive alcohol, use of seatbelts, fire alarms in home, use of sunscreen).  Also reviewed age and gender appropriate health screening as well as vaccine recommendations. Vaccines: pt declined flu.  Prevnar 20 today. Labs: cbc, cmet, flp, psa. Prostate ca screening: PSA today. Colon ca screening: UTD, recall 03/2024.  An After Visit Summary was printed and given to the patient.  Return in about 3 months (around 09/27/2021) for f/u HTN.  Signed:  Crissie Sickles, MD           06/29/2021

## 2021-06-30 ENCOUNTER — Telehealth: Payer: Self-pay

## 2021-06-30 LAB — CBC WITH DIFFERENTIAL/PLATELET
Basophils Absolute: 0.1 10*3/uL (ref 0.0–0.1)
Basophils Relative: 1.2 % (ref 0.0–3.0)
Eosinophils Absolute: 0.2 10*3/uL (ref 0.0–0.7)
Eosinophils Relative: 2.9 % (ref 0.0–5.0)
HCT: 41.9 % (ref 39.0–52.0)
Hemoglobin: 13.4 g/dL (ref 13.0–17.0)
Lymphocytes Relative: 27.9 % (ref 12.0–46.0)
Lymphs Abs: 1.6 10*3/uL (ref 0.7–4.0)
MCHC: 32.1 g/dL (ref 30.0–36.0)
MCV: 87.9 fl (ref 78.0–100.0)
Monocytes Absolute: 0.7 10*3/uL (ref 0.1–1.0)
Monocytes Relative: 11.7 % (ref 3.0–12.0)
Neutro Abs: 3.1 10*3/uL (ref 1.4–7.7)
Neutrophils Relative %: 56.3 % (ref 43.0–77.0)
Platelets: 272 10*3/uL (ref 150.0–400.0)
RBC: 4.76 Mil/uL (ref 4.22–5.81)
RDW: 14.5 % (ref 11.5–15.5)
WBC: 5.6 10*3/uL (ref 4.0–10.5)

## 2021-06-30 LAB — COMPREHENSIVE METABOLIC PANEL
ALT: 17 U/L (ref 0–53)
AST: 17 U/L (ref 0–37)
Albumin: 4.1 g/dL (ref 3.5–5.2)
Alkaline Phosphatase: 53 U/L (ref 39–117)
BUN: 13 mg/dL (ref 6–23)
CO2: 27 mEq/L (ref 19–32)
Calcium: 8.8 mg/dL (ref 8.4–10.5)
Chloride: 101 mEq/L (ref 96–112)
Creatinine, Ser: 0.78 mg/dL (ref 0.40–1.50)
GFR: 92.71 mL/min (ref >60.00)
Glucose, Bld: 89 mg/dL (ref 70–99)
Potassium: 3.7 mEq/L (ref 3.5–5.1)
Sodium: 138 mEq/L (ref 135–145)
Total Bilirubin: 0.5 mg/dL (ref 0.2–1.2)
Total Protein: 6.3 g/dL (ref 6.0–8.3)

## 2021-06-30 LAB — PSA, MEDICARE: PSA: 2.1 ng/ml (ref 0.10–4.00)

## 2021-06-30 LAB — LIPID PANEL
Cholesterol: 208 mg/dL — ABNORMAL HIGH (ref 0–200)
HDL: 52.5 mg/dL (ref >39.00)
LDL Cholesterol: 127 mg/dL — ABNORMAL HIGH (ref 0–99)
NonHDL: 155.23
Total CHOL/HDL Ratio: 4
Triglycerides: 143 mg/dL (ref 0.0–149.0)
VLDL: 28.6 mg/dL (ref 0.0–40.0)

## 2021-06-30 NOTE — Telephone Encounter (Signed)
PA sent via covermymed on 06/30/21   Key: [id number]   Medication: Sildenafil 20mg  tablet   Dx: ED N52.9   Per Dr. pt has tried and failed n/a   Waiting for response.

## 2021-06-30 NOTE — Telephone Encounter (Signed)
Pt was notified of PA denial. He would like alternative medication to try

## 2021-07-01 ENCOUNTER — Encounter: Payer: Self-pay | Admitting: Family Medicine

## 2021-07-01 NOTE — Telephone Encounter (Signed)
Since each insurer covers meds differently, have him call his insurance to see which of the following is his best option from a cost perspective: cialis, levitra, also check to see if the 50mg  or 100mg  sildenafil pills are covered.

## 2021-07-01 NOTE — Telephone Encounter (Signed)
Pt was advised of instructions and will let us know.

## 2021-09-18 ENCOUNTER — Encounter: Payer: Self-pay | Admitting: Family Medicine

## 2021-09-30 ENCOUNTER — Ambulatory Visit (INDEPENDENT_AMBULATORY_CARE_PROVIDER_SITE_OTHER): Payer: Medicare HMO | Admitting: Family Medicine

## 2021-09-30 ENCOUNTER — Encounter: Payer: Self-pay | Admitting: Family Medicine

## 2021-09-30 VITALS — BP 124/74 | HR 52 | Temp 98.0°F | Ht 71.75 in | Wt 248.6 lb

## 2021-09-30 DIAGNOSIS — I10 Essential (primary) hypertension: Secondary | ICD-10-CM

## 2021-09-30 DIAGNOSIS — Z23 Encounter for immunization: Secondary | ICD-10-CM

## 2021-09-30 DIAGNOSIS — Z9189 Other specified personal risk factors, not elsewhere classified: Secondary | ICD-10-CM | POA: Diagnosis not present

## 2021-09-30 MED ORDER — ZOSTER VAC RECOMB ADJUVANTED 50 MCG/0.5ML IM SUSR: 0.5000 mL | Freq: Once | INTRAMUSCULAR | 1 refills | Status: AC

## 2021-09-30 MED ORDER — IRBESARTAN 75 MG PO TABS: 75.0000 mg | ORAL_TABLET | Freq: Every day | ORAL | 0 refills | Status: DC

## 2021-09-30 MED ORDER — TETANUS-DIPHTH-ACELL PERTUSSIS 5-2-15.5 LF-MCG/0.5 IM SUSP: 0.5000 mL | Freq: Once | INTRAMUSCULAR | 0 refills | Status: AC

## 2021-09-30 NOTE — Patient Instructions (Signed)
Check your blood pressure and heart rate regularly at home and write these numbers down to review with me in 3 weeks. ?Bring your blood pressure cuff to your next visit. ?

## 2021-09-30 NOTE — Progress Notes (Signed)
OFFICE VISIT ? ?09/30/2021 ? ?CC:  ?Chief Complaint  ?Patient presents with  ? Hypertension  ?  Follow up; pt is not fasting  ? ?HPI:   ? ?Patient is a 66 y.o. male who presents for 3 mo f/u HTN. ?A/P as of last visit: ?"#1 hypertension. ?Unclear whether this was all secondary to untreated sleep apnea.  Now that he is on CPAP will have him monitor blood pressure regularly at home, discussed goal 130/80 or better.  DASH diet handout given today.  Checking electrolytes and creatinine as well as CBC and lipid panel today. ?  ?2 obstructive sleep apnea.  Feeling better since h bought a CPAP machine.  I do not think it is necessary that he see sleep specialist at this point.  Possibly having do this in the future. ?  ?3) Health maintenance exam: ?Reviewed age and gender appropriate health maintenance issues (prudent diet, regular exercise, health risks of tobacco and excessive alcohol, use of seatbelts, fire alarms in home, use of sunscreen).  Also reviewed age and gender appropriate health screening as well as vaccine recommendations. ?Vaccines: pt declined flu.  Prevnar 20 today. ?Labs: cbc, cmet, flp, psa. ?Prostate ca screening: PSA today. ?Colon ca screening: UTD, recall 03/2024." ? ?INTERIM HX: ?He is feeling well. ?He is active during his day but does no formal exercise. ?Home blood pressure checks consistently 140 on top to the best of his recollection, he did not bring numbers today. ?Says his blood pressure cuff brings up a consistently elevated number, no wide fluctuations or other things to suggest cuff malfunction. ? ?Discussed labs from last visit: ?Labs all normal last visit except LDL mildly elevated at 127.  TLC recommended. ?He does not smoke. ?He has no chest pain, dyspnea on exertion, shortness of breath, palpitations, dizziness, or lower extremity swelling. ?No headaches. ? ? ?Past Medical History:  ?Diagnosis Date  ? Borderline hyperlipidemia 06/2021  ? 10-year Framingham risk 6.5%-->TLC  ?  Diverticulitis 2005  ? recurrent->segmental colectomy  ? GERD (gastroesophageal reflux disease)   ? History of adenomatous polyp of colon   ? 03/2021. Recall 2025  ? Hypertension   ? OSA (obstructive sleep apnea)   ? ? ?Past Surgical History:  ?Procedure Laterality Date  ? COLONOSCOPY    ? 04/2007.  Rpt 2022-[facility]->polypectomy, recall 03/2024.  ? Segmental colectomy  2008  ? d/t recurrent diverticulitis  ? TONSILLECTOMY AND ADENOIDECTOMY  2000  ? and uvulectomy  ? ? ?Outpatient Medications Prior to Visit  ?Medication Sig Dispense Refill  ? Multiple Vitamin (ONE-A-DAY MENS PO) Take by mouth daily.    ? sildenafil (REVATIO) 20 MG tablet Take 1 tablet (20 mg total) by mouth 3 (three) times daily. (Patient not taking: Reported on 09/30/2021) 50 tablet 3  ? ?No facility-administered medications prior to visit.  ? ? ?No Known Allergies ? ?ROS ?As per HPI ? ?PE: ? ?  09/30/2021  ?  9:06 AM 06/29/2021  ?  1:57 PM 01/17/2017  ?  2:27 PM  ?Vitals with BMI  ?Height 5' 11.75" 5' 11.75" 6\' 0"   ?Weight 248 lbs 10 oz 252 lbs 231 lbs 3 oz  ?BMI 33.97 34.43 31.4  ?Systolic 124 130  ?Diastolic 74 82 67  ?Pulse 52 59 65  ? ? ? ?Physical Exam ? ?Gen: Alert, well appearing.  Patient is oriented to person, place, time, and situation. ?AFFECT: pleasant, lucid thought and speech. ?CV: RRR, no m/r/g.   ?LUNGS: CTA bilat, nonlabored resps, good  aeration in all lung fields. ? ?LABS:  ?Last CBC ?Lab Results  ?Component Value Date  ? WBC 5.6 06/29/2021  ? HGB 13.4 06/29/2021  ? HCT 41.9 06/29/2021  ? MCV 87.9 06/29/2021  ? RDW 14.5 06/29/2021  ? PLT 272.0 06/29/2021  ? ?Last metabolic panel ?Lab Results  ?Component Value Date  ? GLUCOSE 89 06/29/2021  ? NA 138 06/29/2021  ? K 3.7 06/29/2021  ? CL 101 06/29/2021  ? CO2 27 06/29/2021  ? BUN 13 06/29/2021  ? CREATININE 0.78 06/29/2021  ? CALCIUM 8.8 06/29/2021  ? PROT 6.3 06/29/2021  ? ALBUMIN 4.1 06/29/2021  ? BILITOT 0.5 06/29/2021  ? ALKPHOS 53 06/29/2021  ? AST 17 06/29/2021  ? ALT 17  06/29/2021  ? ?Last lipids ?Lab Results  ?Component Value Date  ? CHOL 208 (H) 06/29/2021  ? HDL 52.50 06/29/2021  ? LDLCALC 127 (H) 06/29/2021  ? TRIG 143.0 06/29/2021  ? CHOLHDL 4 06/29/2021  ? ?IMPRESSION AND PLAN: ? ?#1 hypertension.  Currently on no meds.   ?BP normal here today but consistently elevated at home. ?Past records indicate history of hypertension.  He is compliant with his CPAP. ?Decided to go ahead and start a low-dose of a irbesartan today--75 mg once daily. ? ?Instructions: Check your blood pressure and heart rate regularly at home and write these numbers down to review with me in 3 weeks. ?Bring your blood pressure cuff to your next visit. ? ?#2 hyperlipidemia.  LDL was 127 3 months ago.  TLC recommended. ?Calculated Framingham 10-year cardiovascular risk is about 6.5%. ?I think further coronary artery disease risk stratification would be helpful in this case--- ordered CT cardiac scoring today. ? ?An After Visit Summary was printed and given to the patient. ? ?FOLLOW UP: Return for f/u HTN.  And bmet ? ?Signed:  Santiago Bumpers, MD           09/30/2021 ? ?

## 2021-10-14 ENCOUNTER — Ambulatory Visit (INDEPENDENT_AMBULATORY_CARE_PROVIDER_SITE_OTHER): Payer: Medicare HMO

## 2021-10-14 DIAGNOSIS — Z0000 Encounter for general adult medical examination without abnormal findings: Secondary | ICD-10-CM

## 2021-10-14 NOTE — Patient Instructions (Signed)
Matthew Hess , ?Thank you for taking time to come for your Medicare Wellness Visit. I appreciate your ongoing commitment to your health goals. Please review the following plan we discussed and let me know if I can assist you in the future.  ? ?Screening recommendations/referrals: ?Colonoscopy: Done 04/13/21 repeat every 5 years  ?Recommended yearly ophthalmology/optometry visit for glaucoma screening and checkup ?Recommended yearly dental visit for hygiene and checkup ? ?Vaccinations: ?Influenza vaccine: declined  ?Pneumococcal vaccine: Up to date ?Tdap vaccine: Done 09/30/21 repeat every 10 years  ?Shingles vaccine: Completed 1st dose 09/30/21   ?Covid-19: Completed 7/16, 01/26/20 ? ?Advanced directives: Please bring a copy of your health care power of attorney and living will to the office at your convenience. ? ?Conditions/risks identified: None at this time  ? ?Next appointment: Follow up in one year for your annual wellness visit.  ? ?Preventive Care 69 Years and Older, Male ?Preventive care refers to lifestyle choices and visits with your health care provider that can promote health and wellness. ?What does preventive care include? ?A yearly physical exam. This is also called an annual well check. ?Dental exams once or twice a year. ?Routine eye exams. Ask your health care provider how often you should have your eyes checked. ?Personal lifestyle choices, including: ?Daily care of your teeth and gums. ?Regular physical activity. ?Eating a healthy diet. ?Avoiding tobacco and drug use. ?Limiting alcohol use. ?Practicing safe sex. ?Taking low doses of aspirin every day. ?Taking vitamin and mineral supplements as recommended by your health care provider. ?What happens during an annual well check? ?The services and screenings done by your health care provider during your annual well check will depend on your age, overall health, lifestyle risk factors, and family history of disease. ?Counseling  ?Your health care  provider may ask you questions about your: ?Alcohol use. ?Tobacco use. ?Drug use. ?Emotional well-being. ?Home and relationship well-being. ?Sexual activity. ?Eating habits. ?History of falls. ?Memory and ability to understand (cognition). ?Work and work Astronomer. ?Screening  ?You may have the following tests or measurements: ?Height, weight, and BMI. ?Blood pressure. ?Lipid and cholesterol levels. These may be checked every 5 years, or more frequently if you are over 44 years old. ?Skin check. ?Lung cancer screening. You may have this screening every year starting at age 71 if you have a 30-pack-year history of smoking and currently smoke or have quit within the past 15 years. ?Fecal occult blood test (FOBT) of the stool. You may have this test every year starting at age 102. ?Flexible sigmoidoscopy or colonoscopy. You may have a sigmoidoscopy every 5 years or a colonoscopy every 10 years starting at age 8. ?Prostate cancer screening. Recommendations will vary depending on your family history and other risks. ?Hepatitis C blood test. ?Hepatitis B blood test. ?Sexually transmitted disease (STD) testing. ?Diabetes screening. This is done by checking your blood sugar (glucose) after you have not eaten for a while (fasting). You may have this done every 1-3 years. ?Abdominal aortic aneurysm (AAA) screening. You may need this if you are a current or former smoker. ?Osteoporosis. You may be screened starting at age 53 if you are at high risk. ?Talk with your health care provider about your test results, treatment options, and if necessary, the need for more tests. ?Vaccines  ?Your health care provider may recommend certain vaccines, such as: ?Influenza vaccine. This is recommended every year. ?Tetanus, diphtheria, and acellular pertussis (Tdap, Td) vaccine. You may need a Td booster every 10 years. ?Zoster  vaccine. You may need this after age 57. ?Pneumococcal 13-valent conjugate (PCV13) vaccine. One dose is  recommended after age 78. ?Pneumococcal polysaccharide (PPSV23) vaccine. One dose is recommended after age 5. ?Talk to your health care provider about which screenings and vaccines you need and how often you need them. ?This information is not intended to replace advice given to you by your health care provider. Make sure you discuss any questions you have with your health care provider. ?Document Released: 07/04/2015 Document Revised: 02/25/2016 Document Reviewed: 04/08/2015 ?Elsevier Interactive Patient Education ? 2017 [place]. ? ?Fall Prevention in the Home ?Falls can cause injuries. They can happen to people of all ages. There are many things you can do to make your home safe and to help prevent falls. ?What can I do on the outside of my home? ?Regularly fix the edges of walkways and driveways and fix any cracks. ?Remove anything that might make you trip as you walk through a door, such as a raised step or threshold. ?Trim any bushes or trees on the path to your home. ?Use bright outdoor lighting. ?Clear any walking paths of anything that might make someone trip, such as rocks or tools. ?Regularly check to see if handrails are loose or broken. Make sure that both sides of any steps have handrails. ?Any raised decks and porches should have guardrails on the edges. ?Have any leaves, snow, or ice cleared regularly. ?Use sand or salt on walking paths during winter. ?Clean up any spills in your garage right away. This includes oil or grease spills. ?What can I do in the bathroom? ?Use night lights. ?Install grab bars by the toilet and in the tub and shower. Do not use towel bars as grab bars. ?Use non-skid mats or decals in the tub or shower. ?If you need to sit down in the shower, use a plastic, non-slip stool. ?Keep the floor dry. Clean up any water that spills on the floor as soon as it happens. ?Remove soap buildup in the tub or shower regularly. ?Attach bath mats securely with double-sided non-slip rug  tape. ?Do not have throw rugs and other things on the floor that can make you trip. ?What can I do in the bedroom? ?Use night lights. ?Make sure that you have a light by your bed that is easy to reach. ?Do not use any sheets or blankets that are too big for your bed. They should not hang down onto the floor. ?Have a firm chair that has side arms. You can use this for support while you get dressed. ?Do not have throw rugs and other things on the floor that can make you trip. ?What can I do in the kitchen? ?Clean up any spills right away. ?Avoid walking on wet floors. ?Keep items that you use a lot in easy-to-reach places. ?If you need to reach something above you, use a strong step stool that has a grab bar. ?Keep electrical cords out of the way. ?Do not use floor polish or wax that makes floors slippery. If you must use wax, use non-skid floor wax. ?Do not have throw rugs and other things on the floor that can make you trip. ?What can I do with my stairs? ?Do not leave any items on the stairs. ?Make sure that there are handrails on both sides of the stairs and use them. Fix handrails that are broken or loose. Make sure that handrails are as long as the stairways. ?Check any carpeting to make sure that it  is firmly attached to the stairs. Fix any carpet that is loose or worn. ?Avoid having throw rugs at the top or bottom of the stairs. If you do have throw rugs, attach them to the floor with carpet tape. ?Make sure that you have a light switch at the top of the stairs and the bottom of the stairs. If you do not have them, ask someone to add them for you. ?What else can I do to help prevent falls? ?Wear shoes that: ?Do not have high heels. ?Have rubber bottoms. ?Are comfortable and fit you well. ?Are closed at the toe. Do not wear sandals. ?If you use a stepladder: ?Make sure that it is fully opened. Do not climb a closed stepladder. ?Make sure that both sides of the stepladder are locked into place. ?Ask someone to  hold it for you, if possible. ?Clearly mark and make sure that you can see: ?Any grab bars or handrails. ?First and last steps. ?Where the edge of each step is. ?Use tools that help you move around (mobility aids) if th

## 2021-10-14 NOTE — Progress Notes (Signed)
Virtual Visit via Telephone Note ? ?I connected with  Matthew Hess on 10/14/21 at  2:30 PM EDT by telephone and verified that I am speaking with the correct person using two identifiers. ? ?Medicare Annual Wellness visit completed telephonically due to Covid-19 pandemic.  ? ?Persons participating in this call: This Health Coach and this patient.  ? ?Location: ?Patient: Home ?Provider: Office  ?  ?I discussed the limitations, risks, security and privacy concerns of performing an evaluation and management service by telephone and the availability of in person appointments. The patient expressed understanding and agreed to proceed. ? ?Unable to perform video visit due to video visit attempted and failed and/or patient does not have video capability.  ? ?Some vital signs may be absent or patient reported.  ? ?Willette Brace, LPN ? ? ?Subjective:  ? Matthew Hess is a 66 y.o. male who presents for an Initial Medicare Annual Wellness Visit. ? ?Review of Systems    ? ?Cardiac Risk Factors include: advanced age (>13men, >77 women);obesity (BMI >30kg/m2);male gender ? ?   ?Objective:  ?  ?There were no vitals filed for this visit. ?There is no height or weight on file to calculate BMI. ? ? ?  10/14/2021  ?  2:27 PM 01/17/2017  ?  2:28 PM  ?Advanced Directives  ?Does Patient Have a Medical Advance Directive? Yes No  ?Type of Advance Directive Living will   ? ? ?Current Medications (verified) ?Outpatient Encounter Medications as of 10/14/2021  ?Medication Sig  ? Multiple Vitamin (ONE-A-DAY MENS PO) Take by mouth daily.  ? irbesartan (AVAPRO) 75 MG tablet Take 1 tablet (75 mg total) by mouth daily. (Patient not taking: Reported on 10/14/2021)  ? ?No facility-administered encounter medications on file as of 10/14/2021.  ? ? ?Allergies (verified) ?Patient has no known allergies.  ? ?History: ?Past Medical History:  ?Diagnosis Date  ? Borderline hyperlipidemia 06/2021  ? 10-year Framingham risk 6.5%-->TLC  ? Diverticulitis  2005  ? recurrent->segmental colectomy  ? GERD (gastroesophageal reflux disease)   ? History of adenomatous polyp of colon   ? 03/2021. Recall 2025  ? Hypertension   ? OSA (obstructive sleep apnea)   ? ?Past Surgical History:  ?Procedure Laterality Date  ? COLONOSCOPY    ? 04/2007.  Rpt 2022-[facility]->[facility]pectomy, recall 03/2024.  ? Segmental colectomy  2008  ? d/t recurrent diverticulitis  ? TONSILLECTOMY AND ADENOIDECTOMY  2000  ? and uvulectomy  ? ?Family History  ?Problem Relation Age of Onset  ? Arthritis Mother   ? Hearing loss Mother   ? Diabetes Mother   ? Cancer Father   ? Depression Father   ? Mental illness Father   ? Arthritis Sister   ? Alcohol abuse Maternal Grandmother   ? Diabetes Paternal Grandmother   ? Alcohol abuse Paternal Grandfather   ? Mental illness Paternal Grandfather   ? Alcohol abuse Daughter   ? Depression Daughter   ? ?Social History  ? ?Socioeconomic History  ? Marital status: Married  ?  Spouse name: Not on file  ? Number of children: Not on file  ? Years of education: Not on file  ? Highest education level: Not on file  ?Occupational History  ? Not on file  ?Tobacco Use  ? Smoking status: Never  ? Smokeless tobacco: Never  ?Substance and Sexual Activity  ? Alcohol use: Yes  ?  Comment: occasionly   ? Drug use: Not on file  ? Sexual activity: Not Currently  ?  Other Topics Concern  ? Not on file  ?Social History Narrative  ? Married, 2 children.  ? 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  ? No tob.  ? Alcohol-->hx of liquor but quit this, ongoing beer about a case per week as of 06/2021.  ? ?Social Determinants of Health  ? ?Financial Resource Strain: Low Risk   ? Difficulty of Paying Living Expenses: Not hard at all  ?Food Insecurity: No Food Insecurity  ? Worried About Charity fundraiser in the Last Year: Never true  ? Ran Out of Food in the Last Year: Never true  ?Transportation Needs: No Transportation Needs  ? Lack of Transportation (Medical): No  ? Lack of Transportation  (Non-Medical): No  ?Physical Activity: Inactive  ? Days of Exercise per Week: 0 days  ? Minutes of Exercise per Session: 0 min  ?Stress: No Stress Concern Present  ? Feeling of Stress : Not at all  ?Social Connections: Socially Integrated  ? Frequency of Communication with Friends and Family: More than three times a week  ? Frequency of Social Gatherings with Friends and Family: More than three times a week  ? Attends Religious Services: More than 4 times per year  ? Active Member of Clubs or Organizations: Yes  ? Attends Archivist Meetings: 1 to 4 times per year  ? Marital Status: Married  ? ? ?Tobacco Counseling ?Counseling given: Not Answered ? ? ?Clinical Intake: ? ?Pre-visit preparation completed: Yes ? ?Pain : No/denies pain ? ?  ? ?BMI - recorded: 33.97 ?Nutritional Status: BMI > 30  Obese ?Nutritional Risks: None ?Diabetes: No ? ?How often do you need to have someone help you when you read instructions, pamphlets, or other written materials from your doctor or pharmacy?: 1 - Never ? ?Diabetic?no ? ?Interpreter Needed?: No ? ?Information entered by :: Charlott Rakes, LPN ? ? ?Activities of Daily Living ? ?  10/14/2021  ?  2:29 PM  ?In your present state of health, do you have any difficulty performing the following activities:  ?Hearing? 0  ?Vision? 0  ?Difficulty concentrating or making decisions? 0  ?Walking or climbing stairs? 0  ?Dressing or bathing? 0  ?Doing errands, shopping? 0  ?Preparing Food and eating ? N  ?Using the Toilet? N  ?In the past six months, have you accidently leaked urine? N  ?Do you have problems with loss of bowel control? N  ?Managing your Medications? N  ?Managing your Finances? N  ?Housekeeping or managing your Housekeeping? N  ? ? ?Patient Care Team: ?McGowen, Adrian Blackwater, MD as PCP - General (Family Medicine) ?Elby Beck, MD as Consulting Physician (Gastroenterology) ? ?Indicate any recent Medical Services you may have received from other than [facility] providers in the  past year (date may be approximate). ? ?   ?Assessment:  ? This is a routine wellness examination for [name]. ? ?Hearing/Vision screen ?Hearing Screening - Comments:: Pt denies any hearing issues  ?Vision Screening - Comments:: Pt follows up with Eye provider from daughter office  for annual eye exams  ? ?Dietary issues and exercise activities discussed: ?Current Exercise Habits: The patient does not participate in regular exercise at present ? ? Goals Addressed   ? ?  ?  ?  ?  ? This Visit's Progress  ?  Patient Stated     ?  None at this time  ?  ? ?  ? ?Depression Screen ? ?  10/14/2021  ?  2:26 PM 06/29/2021  ?  1:57 PM 01/17/2017  ?  2:30 PM  ?PHQ 2/9 Scores  ?PHQ - 2 Score 0 0 0  ?PHQ- 9 Score   0  ?  ?Fall Risk ? ?  10/14/2021  ?  2:28 PM 06/29/2021  ?  1:58 PM  ?Fall Risk   ?Falls in the past year? 1 1  ?Number falls in past yr: 1 0  ?Injury with Fall? 0 1  ?Risk for fall due to : Impaired vision   ?Follow up Falls prevention discussed Falls evaluation completed  ? ? ?FALL RISK PREVENTION PERTAINING TO THE HOME: ? ?Any stairs in or around the home? Yes  ?If so, are there any without handrails? No  ?Home free of loose throw rugs in walkways, pet beds, electrical cords, etc? Yes  ?Adequate lighting in your home to reduce risk of falls? Yes  ? ?ASSISTIVE DEVICES UTILIZED TO PREVENT FALLS: ? ?Life alert? No  ?Use of a cane, walker or w/c? No  ?Grab bars in the bathroom? No  ?Shower chair or bench in shower? Yes  ?Elevated toilet seat or a handicapped toilet? Yes  ? ?TIMED UP AND GO: ? ?Was the test performed? No .  ?Cognitive Function: ?  ?  ? ?  10/14/2021  ?  2:30 PM  ?6CIT Screen  ?What Year? 0 points  ?What month? 0 points  ? ? ?Immunizations ?Immunization History  ?Administered Date(s) Administered  ? Influenza-Unspecified 06/21/2008  ? PFIZER(Purple Top)SARS-COV-2 Vaccination 01/04/2020, 01/26/2020  ? PNEUMOCOCCAL CONJUGATE-20 06/29/2021  ? Tdap 06/21/2008, 09/30/2021  ? Zoster Recombinat (Shingrix) 09/30/2021   ? ? ?TDAP status: Up to date ? ?Flu Vaccine status: Up to date ? ?Pneumococcal vaccine status: Up to date ? ?Covid-19 vaccine status: Completed vaccines ? ?Qualifies for Shingles Vaccine? Yes   ?[place]

## 2021-10-19 DIAGNOSIS — I77810 Thoracic aortic ectasia: Secondary | ICD-10-CM

## 2021-10-19 HISTORY — PX: OTHER SURGICAL HISTORY: SHX169

## 2021-10-19 HISTORY — DX: Thoracic aortic ectasia: I77.810

## 2021-10-21 ENCOUNTER — Ambulatory Visit (HOSPITAL_COMMUNITY)
Admission: RE | Admit: 2021-10-21 | Discharge: 2021-10-21 | Disposition: A | Discharge status: Home / Self Care | Payer: Self-pay | Source: Ambulatory Visit | Attending: Family Medicine | Admitting: Family Medicine

## 2021-10-21 DIAGNOSIS — Z9189 Other specified personal risk factors, not elsewhere classified: Secondary | ICD-10-CM | POA: Insufficient documentation

## 2021-10-22 ENCOUNTER — Encounter: Payer: Self-pay | Admitting: Family Medicine

## 2021-10-22 ENCOUNTER — Ambulatory Visit (INDEPENDENT_AMBULATORY_CARE_PROVIDER_SITE_OTHER): Payer: Medicare HMO | Admitting: Family Medicine

## 2021-10-22 VITALS — BP 148/87 | HR 57 | Temp 98.0°F | Ht 71.75 in | Wt 248.4 lb

## 2021-10-22 DIAGNOSIS — I10 Essential (primary) hypertension: Secondary | ICD-10-CM

## 2021-10-22 NOTE — Progress Notes (Signed)
OFFICE VISIT ? ?10/22/2021 ? ?CC:  ?Chief Complaint  ?Patient presents with  ? Hypertension  ?  Has not taken bp meds prescribed  ? Hyperlipidemia  ? ? ?Patient is a 66 y.o. male who presents for 3 wk f/u HTN and HLD. ?A/P as of last visit: ?"#1 hypertension.  Currently on no meds.   ?BP normal here today but consistently elevated at home. ?Past records indicate history of hypertension.  He is compliant with his CPAP. ?Decided to go ahead and start a low-dose of a irbesartan today--75 mg once daily. ?  ?Instructions: Check your blood pressure and heart rate regularly at home and write these numbers down to review with me in 3 weeks. ?Bring your blood pressure cuff to your next visit. ?  ?#2 hyperlipidemia.  LDL was 127 3 months ago.  TLC recommended. ?Calculated Framingham 10-year cardiovascular risk is about 6.5%. ?I think further coronary artery disease risk stratification would be helpful in this case--- ordered CT cardiac scoring today." ? ?INTERIM HX: ?Patient did not start the medication yet.  Home blood pressures consistently 140/90 average. ? ?Coronary calcium scoring done yesterday showed 21st percentile. ?Discussed potentially starting statin medication with patient today. ? ?Past Medical History:  ?Diagnosis Date  ? Borderline hyperlipidemia 06/2021  ? 10-year Framingham risk 6.5%-->TLC  ? Diverticulitis 2005  ? recurrent->segmental colectomy  ? GERD (gastroesophageal reflux disease)   ? History of adenomatous polyp of colon   ? 03/2021. Recall 2025  ? Hypertension   ? Mild dilation of ascending aorta (HCC) 10/2021  ? 68mm-->rpt 1 yr CTA or MRA  ? OSA (obstructive sleep apnea)   ? ? ?Past Surgical History:  ?Procedure Laterality Date  ? COLONOSCOPY    ? 04/2007.  Rpt 2022-[facility]->polypectomy, recall 03/2024.  ? CORONARY CALCIUM SCORE  10/2021  ? 2023-->21st %'tile  ? Segmental colectomy  2008  ? d/t recurrent diverticulitis  ? TONSILLECTOMY AND ADENOIDECTOMY  2000  ? and uvulectomy  ? ? ?Outpatient  Medications Prior to Visit  ?Medication Sig Dispense Refill  ? Multiple Vitamin (ONE-A-DAY MENS PO) Take by mouth daily.    ? irbesartan (AVAPRO) 75 MG tablet Take 1 tablet (75 mg total) by mouth daily. (Patient not taking: Reported on 10/14/2021) 30 tablet 0  ? ?No facility-administered medications prior to visit.  ? ? ?No Known Allergies ? ?ROS ?As per HPI ? ?PE: ? ?  10/22/2021  ?  8:37 AM 09/30/2021  ?  9:06 AM 06/29/2021  ?  1:57 PM  ?Vitals with BMI  ?Height 5' 11.75" 5' 11.75" 5' 11.75"  ?Weight 248 lbs 6 oz 248 lbs 10 oz 252 lbs  ?BMI 33.94 33.97 34.43  ?Systolic 148 124 951  ?Diastolic 87 74 82  ?Pulse 57 52 59  ? ? ? ?Physical Exam ? ?Gen: Alert, well appearing.  Patient is oriented to person, place, time, and situation. ?AFFECT: pleasant, lucid thought and speech. ?No further exam today. ? ?LABS:  ?Last CBC ?Lab Results  ?Component Value Date  ? WBC 5.6 06/29/2021  ? HGB 13.4 06/29/2021  ? HCT 41.9 06/29/2021  ? MCV 87.9 06/29/2021  ? RDW 14.5 06/29/2021  ? PLT 272.0 06/29/2021  ? ?Last metabolic panel ?Lab Results  ?Component Value Date  ? GLUCOSE 89 06/29/2021  ? NA 138 06/29/2021  ? K 3.7 06/29/2021  ? CL 101 06/29/2021  ? CO2 27 06/29/2021  ? BUN 13 06/29/2021  ? CREATININE 0.78 06/29/2021  ? CALCIUM 8.8 06/29/2021  ?  PROT 6.3 06/29/2021  ? ALBUMIN 4.1 06/29/2021  ? BILITOT 0.5 06/29/2021  ? ALKPHOS 53 06/29/2021  ? AST 17 06/29/2021  ? ALT 17 06/29/2021  ? ?Last lipids ?Lab Results  ?Component Value Date  ? CHOL 208 (H) 06/29/2021  ? HDL 52.50 06/29/2021  ? LDLCALC 127 (H) 06/29/2021  ? TRIG 143.0 06/29/2021  ? CHOLHDL 4 06/29/2021  ? ?IMPRESSION AND PLAN: ? ?Uncontrolled hypertension, he is ready and willing to start a irbesartan 75 mg daily at this time. ?Check blood pressures daily and come back for office visit to review these in 2 weeks and will get repeat electrolytes and creatinine at that time. ? ?An After Visit Summary was printed and given to the patient. ? ?FOLLOW UP: No follow-ups on  file. ? ?Signed:  Santiago Bumpers, MD           10/22/2021 ? ?

## 2021-10-27 ENCOUNTER — Other Ambulatory Visit: Payer: Self-pay | Admitting: Family Medicine

## 2021-11-17 ENCOUNTER — Encounter: Payer: Self-pay | Admitting: Family Medicine

## 2021-11-17 ENCOUNTER — Ambulatory Visit (INDEPENDENT_AMBULATORY_CARE_PROVIDER_SITE_OTHER): Payer: Medicare HMO | Admitting: Family Medicine

## 2021-11-17 VITALS — BP 127/74 | HR 57 | Temp 98.2°F | Ht 71.75 in | Wt 248.8 lb

## 2021-11-17 DIAGNOSIS — I10 Essential (primary) hypertension: Secondary | ICD-10-CM | POA: Diagnosis not present

## 2021-11-17 MED ORDER — IRBESARTAN 75 MG PO TABS: 75.0000 mg | ORAL_TABLET | Freq: Every day | ORAL | 1 refills | Status: DC

## 2021-11-17 NOTE — Progress Notes (Signed)
OFFICE VISIT  11/17/2021  CC:  Chief Complaint  Patient presents with   Hypertension   Patient is a 65 y.o. male who presents for 3 wk f/u HTN. A/P as of last visit: "Uncontrolled hypertension, he is ready and willing to start a irbesartan 75 mg daily at this time. Check blood pressures daily and come back for office visit to review these in 2 weeks and will get repeat electrolytes and creatinine at that time."  INTERIM HX: Matthew Hess feels very well. He feels like getting on the irbesartan helped him feel overall physically better.  Occasional home blood pressure check is less than 130/80.  Past Medical History:  Diagnosis Date   Borderline hyperlipidemia 06/2021   10-year Framingham risk 6.5%-->TLC   Diverticulitis 2005   recurrent->segmental colectomy   GERD (gastroesophageal reflux disease)    History of adenomatous polyp of colon    03/2021. Recall 2025   Hypertension    Mild dilation of ascending aorta (HCC) 10/2021   41mm-->rpt 1 yr CTA or MRA   OSA (obstructive sleep apnea)     Past Surgical History:  Procedure Laterality Date   COLONOSCOPY     04/2007.  Rpt 2022-[facility]->[facility]pectomy, recall 03/2024.   CORONARY CALCIUM SCORE  10/2021   2023-->21st %'tile   Segmental colectomy  2008   d/t recurrent diverticulitis   TONSILLECTOMY AND ADENOIDECTOMY  2000   and uvulectomy    Outpatient Medications Prior to Visit  Medication Sig Dispense Refill   Multiple Vitamin (ONE-A-DAY MENS PO) Take by mouth daily.     irbesartan (AVAPRO) 75 MG tablet TAKE 1 TABLET BY MOUTH EVERY DAY 30 tablet 0   No facility-administered medications prior to visit.    No Known Allergies  ROS As per HPI  PE:    11/17/2021    8:16 AM 10/22/2021    8:37 AM 09/30/2021    9:06 AM  Vitals with BMI  Height 5' 11.75" 5' 11.75" 5' 11.75"  Weight 248 lbs 13 oz 248 lbs 6 oz 248 lbs 10 oz  BMI 34 33.94 33.97  Systolic 127 148 735  Diastolic 74 87 74  Pulse 57 57 52     Physical  Exam  Gen: Alert, well appearing.  Patient is oriented to person, place, time, and situation. AFFECT: pleasant, lucid thought and speech. CV: RRR, no m/r/g.   LUNGS: CTA bilat, nonlabored resps, good aeration in all lung fields. EXT: no clubbing or cyanosis.  no edema.    LABS:  Last CBC Lab Results  Component Value Date   WBC 5.6 06/29/2021   HGB 13.4 06/29/2021   HCT 41.9 06/29/2021   MCV 87.9 06/29/2021   RDW 14.5 06/29/2021   PLT 272.0 06/29/2021   Last metabolic panel Lab Results  Component Value Date   GLUCOSE 89 06/29/2021   NA 138 06/29/2021   K 3.7 06/29/2021   CL 101 06/29/2021   CO2 27 06/29/2021   BUN 13 06/29/2021   CREATININE 0.78 06/29/2021   CALCIUM 8.8 06/29/2021   PROT 6.3 06/29/2021   ALBUMIN 4.1 06/29/2021   BILITOT 0.5 06/29/2021   ALKPHOS 53 06/29/2021   AST 17 06/29/2021   ALT 17 06/29/2021   Last lipids Lab Results  Component Value Date   CHOL 208 (H) 06/29/2021   HDL 52.50 06/29/2021   LDLCALC 127 (H) 06/29/2021   TRIG 143.0 06/29/2021   CHOLHDL 4 06/29/2021   IMPRESSION AND PLAN:  Hypertension, well controlled since getting on irbesartan 75  mg daily. Electrolytes and creatinine today.  An After Visit Summary was printed and given to the patient.  FOLLOW UP: Return for annual CPE (fasting).  Signed:  Santiago Bumpers, MD           11/17/2021

## 2021-11-18 LAB — BASIC METABOLIC PANEL
BUN: 16 mg/dL (ref 7–25)
CO2: 26 mmol/L (ref 20–32)
Calcium: 9 mg/dL (ref 8.6–10.3)
Chloride: 104 mmol/L (ref 98–110)
Creat: 0.89 mg/dL (ref 0.70–1.35)
Glucose, Bld: 96 mg/dL (ref 65–99)
Potassium: 4 mmol/L (ref 3.5–5.3)
Sodium: 140 mmol/L (ref 135–146)

## 2022-05-24 ENCOUNTER — Encounter: Payer: Self-pay | Admitting: Family Medicine

## 2022-05-24 ENCOUNTER — Ambulatory Visit (INDEPENDENT_AMBULATORY_CARE_PROVIDER_SITE_OTHER): Payer: Medicare HMO | Admitting: Family Medicine

## 2022-05-24 VITALS — BP 136/80 | HR 59 | Temp 98.2°F | Ht 72.0 in | Wt 258.6 lb

## 2022-05-24 DIAGNOSIS — Z125 Encounter for screening for malignant neoplasm of prostate: Secondary | ICD-10-CM

## 2022-05-24 DIAGNOSIS — I10 Essential (primary) hypertension: Secondary | ICD-10-CM | POA: Diagnosis not present

## 2022-05-24 DIAGNOSIS — Z0000 Encounter for general adult medical examination without abnormal findings: Secondary | ICD-10-CM | POA: Diagnosis not present

## 2022-05-24 MED ORDER — IRBESARTAN 150 MG PO TABS: 150.0000 mg | ORAL_TABLET | Freq: Every day | ORAL | 1 refills | Status: DC

## 2022-05-24 NOTE — Patient Instructions (Addendum)
I increased your blood pressure dose to 150 mg today. Check your blood pressure and heart rate once a day for the next 2 weeks and make appointment to review these at that time.   Health Maintenance, Male Adopting a healthy lifestyle and getting preventive care are important in promoting health and wellness. Ask your health care provider about: The right schedule for you to have regular tests and exams. Things you can do on your own to prevent diseases and keep yourself healthy. What should I know about diet, weight, and exercise? Eat a healthy diet  Eat a diet that includes plenty of vegetables, fruits, low-fat dairy products, and lean protein. Do not eat a lot of foods that are high in solid fats, added sugars, or sodium. Maintain a healthy weight Body mass index (BMI) is a measurement that can be used to identify possible weight problems. It estimates body fat based on height and weight. Your health care provider can help determine your BMI and help you achieve or maintain a healthy weight. Get regular exercise Get regular exercise. This is one of the most important things you can do for your health. Most adults should: Exercise for at least 150 minutes each week. The exercise should increase your heart rate and make you sweat (moderate-intensity exercise). Do strengthening exercises at least twice a week. This is in addition to the moderate-intensity exercise. Spend less time sitting. Even light physical activity can be beneficial. Watch cholesterol and blood lipids Have your blood tested for lipids and cholesterol at 67 years of age, then have this test every 5 years. You may need to have your cholesterol levels checked more often if: Your lipid or cholesterol levels are high. You are older than 67 years of age. You are at high risk for heart disease. What should I know about cancer screening? Many types of cancers can be detected early and may often be prevented. Depending on your  health history and family history, you may need to have cancer screening at various ages. This may include screening for: Colorectal cancer. Prostate cancer. Skin cancer. Lung cancer. What should I know about heart disease, diabetes, and high blood pressure? Blood pressure and heart disease High blood pressure causes heart disease and increases the risk of stroke. This is more likely to develop in people who have high blood pressure readings or are overweight. Talk with your health care provider about your target blood pressure readings. Have your blood pressure checked: Every 3-5 years if you are 76-10 years of age. Every year if you are 46 years old or older. If you are between the ages of 12 and 89 and are a current or former smoker, ask your health care provider if you should have a one-time screening for abdominal aortic aneurysm (AAA). Diabetes Have regular diabetes screenings. This checks your fasting blood sugar level. Have the screening done: Once every three years after age 50 if you are at a normal weight and have a low risk for diabetes. More often and at a younger age if you are overweight or have a high risk for diabetes. What should I know about preventing infection? Hepatitis B If you have a higher risk for hepatitis B, you should be screened for this virus. Talk with your health care provider to find out if you are at risk for hepatitis B infection. Hepatitis C Blood testing is recommended for: Everyone born from [birth year] through 1966. Anyone with known risk factors for hepatitis C. Sexually transmitted infections (  STIs) You should be screened each year for STIs, including gonorrhea and chlamydia, if: You are sexually active and are younger than 67 years of age. You are older than 67 years of age and your health care provider tells you that you are at risk for this type of infection. Your sexual activity has changed since you were last screened, and you are at increased risk  for chlamydia or gonorrhea. Ask your health care provider if you are at risk. Ask your health care provider about whether you are at high risk for HIV. Your health care provider may recommend a prescription medicine to help prevent HIV infection. If you choose to take medicine to prevent HIV, you should first get tested for HIV. You should then be tested every 3 months for as long as you are taking the medicine. Follow these instructions at home: Alcohol use Do not drink alcohol if your health care provider tells you not to drink. If you drink alcohol: Limit how much you have to 0-2 drinks a day. Know how much alcohol is in your drink. In the [place] one drink equals one 12 oz bottle of beer (355 mL), one 5 oz glass of wine (148 mL), or one 1 oz glass of hard liquor (44 mL). Lifestyle Do not use any products that contain nicotine or tobacco. These products include cigarettes, chewing tobacco, and vaping devices, such as e-cigarettes. If you need help quitting, ask your health care provider. Do not use street drugs. Do not share needles. Ask your health care provider for help if you need support or information about quitting drugs. General instructions Schedule regular health, dental, and eye exams. Stay current with your vaccines. Tell your health care provider if: You often feel depressed. You have ever been abused or do not feel safe at home. Summary Adopting a healthy lifestyle and getting preventive care are important in promoting health and wellness. Follow your health care provider's instructions about healthy diet, exercising, and getting tested or screened for diseases. Follow your health care provider's instructions on monitoring your cholesterol and blood pressure. This information is not intended to replace advice given to you by your health care provider. Make sure you discuss any questions you have with your health care provider. Document Revised: 10/27/2020 Document Reviewed:  10/27/2020 Elsevier Patient Education  2023 [employer].

## 2022-05-24 NOTE — Progress Notes (Signed)
Office Note 05/24/2022  CC:  Chief Complaint  Patient presents with   Annual Exam    Pt is not fasting    HPI:  Patient is a 66 y.o. male who is here for annual health maintenance exam and follow-up hypertension.  He is feeling well although frustrated that he cannot lose any weight.  However he states that he is eating what ever he wants and he is not exercising.  No home blood pressure monitoring.  Past Medical History:  Diagnosis Date   Borderline hyperlipidemia 06/2021   10-year Framingham risk 6.5%-->TLC   Diverticulitis 2005   recurrent->segmental colectomy   GERD (gastroesophageal reflux disease)    History of adenomatous polyp of colon    03/2021. 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  Rpt 2022-[facility]->polypectomy, recall 03/2024.   CORONARY CALCIUM SCORE  10/2021   2023-->21st %'tile   Segmental colectomy  2008   d/t recurrent diverticulitis   TONSILLECTOMY AND ADENOIDECTOMY  2000   and uvulectomy    Family History  Problem Relation Age of Onset   Arthritis Mother    Hearing loss Mother    Diabetes Mother    Cancer Father    Depression Father    Mental illness Father    Arthritis Sister    Alcohol abuse Maternal Grandmother    Diabetes Paternal Grandmother    Alcohol abuse Paternal Grandfather    Mental illness Paternal Grandfather    Alcohol abuse Daughter    Depression Daughter     Social History   Socioeconomic History   Marital status: Married    Spouse name: Not on file   Number of children: Not on file   Years of education: Not on file   Highest education level: Not on file  Occupational History   Not on file  Tobacco Use   Smoking status: Never   Smokeless tobacco: Never  Substance and Sexual Activity   Alcohol use: Yes    Comment: occasionly     Drug use: Not on file   Sexual activity: Not Currently  Other Topics Concern   Not on file  Social History Narrative   Married, 2 children.   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   No tob.   Alcohol-->hx of liquor but quit this, ongoing beer about a case per week as of 06/2021.   Social Determinants of Health   Financial Resource Strain: Low Risk  (10/14/2021)   Overall Financial Resource Strain (CARDIA)    Difficulty of Paying Living Expenses: Not hard at all  Food Insecurity: No Food Insecurity (10/14/2021)   Hunger Vital Sign    Worried About Running Out of Food in the Last Year: Never true    Ran Out of Food in the Last Year: Never true  Transportation Needs: No Transportation Needs (10/14/2021)   PRAPARE - Administrator, Civil Service (Medical): No    Lack of Transportation (Non-Medical): No  Physical Activity: Inactive (10/14/2021)   Exercise Vital Sign    Days of Exercise per Week: 0 days    Minutes of Exercise per Session: 0 min  Stress: No Stress Concern Present (10/14/2021)   [employer] of Occupational Health - Occupational Stress Questionnaire    Feeling of Stress :  Not at all  Social Connections: Socially Integrated (10/14/2021)   Social Connection and Isolation Panel [NHANES]    Frequency of Communication with Friends and Family: More than three times a week    Frequency of Social Gatherings with Friends and Family: More than three times a week    Attends Religious Services: More than 4 times per year    Active Member of [employer] or Organizations: Yes    Attends Banker Meetings: 1 to 4 times per year    Marital Status: Married  Catering manager Violence: Not At Risk (10/14/2021)   Humiliation, Afraid, Rape, and Kick questionnaire    Fear of Current or Ex-Partner: No    Emotionally Abused: No    Physically Abused: No    Sexually Abused: No    Outpatient Medications Prior to Visit  Medication Sig Dispense Refill    irbesartan (AVAPRO) 75 MG tablet Take 1 tablet (75 mg total) by mouth daily. 90 tablet 1   Multiple Vitamin (ONE-A-DAY MENS PO) Take by mouth daily.     No facility-administered medications prior to visit.    No Known Allergies  ROS Review of Systems  Constitutional:  Negative for appetite change, chills, fatigue and fever.  HENT:  Negative for congestion, dental problem, ear pain and sore throat.   Eyes:  Negative for discharge, redness and visual disturbance.  Respiratory:  Negative for cough, chest tightness, shortness of breath and wheezing.   Cardiovascular:  Negative for chest pain, palpitations and leg swelling.  Gastrointestinal:  Negative for abdominal pain, blood in stool, diarrhea, nausea and vomiting.  Genitourinary:  Negative for difficulty urinating, dysuria, flank pain, frequency, hematuria and urgency.  Musculoskeletal:  Negative for arthralgias, back pain, joint swelling, myalgias and neck stiffness.  Skin:  Negative for pallor and rash.  Neurological:  Negative for dizziness, speech difficulty, weakness and headaches.  Hematological:  Negative for adenopathy. Does not bruise/bleed easily.  Psychiatric/Behavioral:  Negative for confusion and sleep disturbance. The patient is not nervous/anxious.     PE;    05/24/2022    8:55 AM 05/24/2022    8:37 AM 11/17/2021    8:16 AM  Vitals with BMI  Height  6\' 0"  5' 11.75"  Weight  258 lbs 10 oz 248 lbs 13 oz  BMI  35.06 34  Systolic 136 145 08-03-1980  Diastolic 80 83 74  Pulse  59 57   Gen: Alert, well appearing.  Patient is oriented to person, place, time, and situation. AFFECT: pleasant, lucid thought and speech. ENT: Ears: EACs clear, normal epithelium.  TMs with good light reflex and landmarks bilaterally.  Eyes: no injection, icteris, swelling, or exudate.  EOMI, PERRLA. Nose: no drainage or turbinate edema/swelling.  No injection or focal lesion.  Mouth: lips without lesion/swelling.  Oral mucosa pink and moist.   Dentition intact and without obvious caries or gingival swelling.  Oropharynx without erythema, exudate, or swelling.  Neck: supple/nontender.  No LAD, mass, or TM.  Carotid pulses 2+ bilaterally, without bruits. CV: RRR, no m/r/g.   LUNGS: CTA bilat, nonlabored resps, good aeration in all lung fields. ABD: soft, NT, ND, BS normal.  No hepatospenomegaly or mass.  No bruits. EXT: no clubbing, cyanosis, or edema.  Musculoskeletal: no joint swelling, erythema, warmth, or tenderness.  ROM of all joints intact. Skin - no sores or suspicious lesions or rashes or color changes  Pertinent labs:  No results found for: "TSH" Lab Results  Component Value Date   WBC  5.6 06/29/2021   HGB 13.4 06/29/2021   HCT 41.9 06/29/2021   MCV 87.9 06/29/2021   PLT 272.0 06/29/2021   Lab Results  Component Value Date   CREATININE 0.89 11/17/2021   BUN 16 11/17/2021   NA 140 11/17/2021   K 4.0 11/17/2021   CL 104 11/17/2021   CO2 26 11/17/2021   Lab Results  Component Value Date   ALT 17 06/29/2021   AST 17 06/29/2021   ALKPHOS 53 06/29/2021   BILITOT 0.5 06/29/2021   Lab Results  Component Value Date   CHOL 208 (H) 06/29/2021   Lab Results  Component Value Date   HDL 52.50 06/29/2021   Lab Results  Component Value Date   LDLCALC 127 (H) 06/29/2021   Lab Results  Component Value Date   TRIG 143.0 06/29/2021   Lab Results  Component Value Date   CHOLHDL 4 06/29/2021   Lab Results  Component Value Date   PSA 2.10 06/29/2021   ASSESSMENT AND PLAN:   #1 health maintenance exam: Reviewed age and gender appropriate health maintenance issues (prudent diet, regular exercise, health risks of tobacco and excessive alcohol, use of seatbelts, fire alarms in home, use of sunscreen).  Also reviewed age and gender appropriate health screening as well as vaccine recommendations. Vaccines: Flu->pt declined.  Shingrix #2-> he will get this at his pharmacy Labs: fasting HP + PSA Prostate ca  screening: PSA today Colon ca screening: recall 2025  2 uncontrolled hypertension. Increase irbesartan to 150 mg a day.  Monitor blood pressure at home and come back in 2 weeks to review.  An After Visit Summary was printed and given to the patient.  FOLLOW UP:  No follow-ups on file.  Signed:  Santiago Bumpers, MD           05/24/2022

## 2022-05-25 ENCOUNTER — Encounter: Payer: Self-pay | Admitting: Family Medicine

## 2022-05-25 ENCOUNTER — Telehealth: Payer: Self-pay

## 2022-05-25 DIAGNOSIS — E7800 Pure hypercholesterolemia, unspecified: Secondary | ICD-10-CM

## 2022-05-25 LAB — COMPREHENSIVE METABOLIC PANEL
AG Ratio: 2 (calc) (ref 1.0–2.5)
ALT: 15 U/L (ref 9–46)
AST: 14 U/L (ref 10–35)
Albumin: 4.1 g/dL (ref 3.6–5.1)
Alkaline phosphatase (APISO): 54 U/L (ref 35–144)
BUN: 13 mg/dL (ref 7–25)
CO2: 27 mmol/L (ref 20–32)
Calcium: 8.9 mg/dL (ref 8.6–10.3)
Chloride: 104 mmol/L (ref 98–110)
Creat: 0.82 mg/dL (ref 0.70–1.35)
Globulin: 2.1 g/dL (calc) (ref 1.9–3.7)
Glucose, Bld: 97 mg/dL (ref 65–99)
Potassium: 4.2 mmol/L (ref 3.5–5.3)
Sodium: 142 mmol/L (ref 135–146)
Total Bilirubin: 0.5 mg/dL (ref 0.2–1.2)
Total Protein: 6.2 g/dL (ref 6.1–8.1)

## 2022-05-25 LAB — CBC
HCT: 41.5 % (ref 38.5–50.0)
Hemoglobin: 13.7 g/dL (ref 13.2–17.1)
MCH: 29.6 pg (ref 27.0–33.0)
MCHC: 33 g/dL (ref 32.0–36.0)
MCV: 89.6 fL (ref 80.0–100.0)
MPV: 9.5 fL (ref 7.5–12.5)
Platelets: 296 10*3/uL (ref 140–400)
RBC: 4.63 10*6/uL (ref 4.20–5.80)
RDW: 13.7 % (ref 11.0–15.0)
WBC: 5 10*3/uL (ref 3.8–10.8)

## 2022-05-25 LAB — LIPID PANEL
Cholesterol: 209 mg/dL — ABNORMAL HIGH (ref <200)
HDL: 60 mg/dL (ref >=40)
LDL Cholesterol (Calc): 124 mg/dL (calc) — ABNORMAL HIGH
Non-HDL Cholesterol (Calc): 149 mg/dL (calc) — ABNORMAL HIGH (ref <130)
Total CHOL/HDL Ratio: 3.5 (calc) (ref <5.0)
Triglycerides: 130 mg/dL (ref <150)

## 2022-05-25 LAB — PSA: PSA: 1.35 ng/mL (ref <=4.00)

## 2022-05-25 LAB — TSH: TSH: 2.52 mIU/L (ref 0.40–4.50)

## 2022-05-25 MED ORDER — ATORVASTATIN CALCIUM 20 MG PO TABS
20.0000 mg | ORAL_TABLET | Freq: Every day | ORAL | 2 refills | Status: DC
Start: 2022-05-25 — End: 2022-08-26

## 2022-05-25 NOTE — Telephone Encounter (Signed)
-----   Message from Jeoffrey Massed, MD sent at 05/25/2022 10:15 AM EST ----- (10 yr frmhm cv risk= 14%)  All labs normal except cholesterol elevated. I recommend pt start cholesterol-lowering therapy in order to try to decrease their risk of developing cardiovascular disease or stroke.  A atorvastatin 20 mg, 1 tab p.o. daily, #30, refill x2. Recheck FLP--lab visit only--in 2-3 months, dx is pure hypercholesterolemia.--thx

## 2022-06-15 ENCOUNTER — Other Ambulatory Visit: Payer: Self-pay | Admitting: Family Medicine

## 2022-06-20 ENCOUNTER — Other Ambulatory Visit: Payer: Self-pay | Admitting: Family Medicine

## 2022-06-29 ENCOUNTER — Other Ambulatory Visit: Payer: Self-pay | Admitting: Family Medicine

## 2022-08-18 DIAGNOSIS — L720 Epidermal cyst: Secondary | ICD-10-CM | POA: Diagnosis not present

## 2022-08-19 ENCOUNTER — Other Ambulatory Visit: Payer: Self-pay | Admitting: Family Medicine

## 2022-08-22 ENCOUNTER — Other Ambulatory Visit: Payer: Self-pay | Admitting: Family Medicine

## 2022-08-23 NOTE — Telephone Encounter (Signed)
Pt has upcoming appt on 3/11, will complete labs. Refill will be addressed at that time.

## 2022-08-30 ENCOUNTER — Other Ambulatory Visit: Payer: Medicare HMO

## 2022-08-30 ENCOUNTER — Encounter: Payer: Self-pay | Admitting: Family Medicine

## 2022-08-30 ENCOUNTER — Ambulatory Visit (INDEPENDENT_AMBULATORY_CARE_PROVIDER_SITE_OTHER): Payer: Medicare HMO | Admitting: Family Medicine

## 2022-08-30 ENCOUNTER — Telehealth: Payer: Self-pay

## 2022-08-30 VITALS — BP 128/81 | HR 56 | Temp 98.0°F | Ht 72.0 in | Wt 245.2 lb

## 2022-08-30 DIAGNOSIS — I10 Essential (primary) hypertension: Secondary | ICD-10-CM

## 2022-08-30 DIAGNOSIS — E7800 Pure hypercholesterolemia, unspecified: Secondary | ICD-10-CM

## 2022-08-30 MED ORDER — ATORVASTATIN CALCIUM 20 MG PO TABS: 20.0000 mg | ORAL_TABLET | Freq: Every day | ORAL | 2 refills | Status: DC

## 2022-08-30 MED ORDER — IRBESARTAN 150 MG PO TABS: 150.0000 mg | ORAL_TABLET | Freq: Every day | ORAL | 2 refills | Status: DC

## 2022-08-30 NOTE — Telephone Encounter (Signed)
Spoke with pt to schedule AWV in office. Patient declined to schedule wellness visit at this time.   

## 2022-08-30 NOTE — Progress Notes (Signed)
OFFICE VISIT  08/30/2022  CC:  Chief Complaint  Patient presents with   Medical Management of Chronic Issues    Pt is fasting    Patient is a 67 y.o. male who presents for 35-monthfollow-up uncontrolled hypertension A/P as of last visit: "1 health maintenance exam: Reviewed age and gender appropriate health maintenance issues (prudent diet, regular exercise, health risks of tobacco and excessive alcohol, use of seatbelts, fire alarms in home, use of sunscreen).  Also reviewed age and gender appropriate health screening as well as vaccine recommendations. Vaccines: Flu->pt declined.  Shingrix #2-> he will get this at his pharmacy Labs: fasting HP + PSA Prostate ca screening: PSA today Colon ca screening: recall 2025   2 uncontrolled hypertension. Increase irbesartan to 150 mg a day.  Monitor blood pressure at home and come back in 2 weeks to review."  INTERIM HX: Feeling well. Atorvastatin started 05/2022.  No side effects. No home blood pressure monitoring.  Past Medical History:  Diagnosis Date   Diverticulitis 2005   recurrent->segmental colectomy   GERD (gastroesophageal reflux disease)    History of adenomatous polyp of colon    03/2021. Recall 2025   Hypercholesterolemia    04/2022 frmhm cv risk= 14%-->statin recommended   Hypertension    Mild dilation of ascending aorta ([facility] 10/2021   427m->rpt 1 yr CTA or MRA   OSA (obstructive sleep apnea)     Past Surgical History:  Procedure Laterality Date   COLONOSCOPY     04/2007.  Rpt 2022-[facility]->polypectomy, recall 03/2024.   CORONARY CALCIUM SCORE  10/2021   2023-->21st %'tile   Segmental colectomy  2008   d/t recurrent diverticulitis   TONSILLECTOMY AND ADENOIDECTOMY  2000   and uvulectomy    Outpatient Medications Prior to Visit  Medication Sig Dispense Refill   Multiple Vitamin (ONE-A-DAY MENS PO) Take by mouth daily.     atorvastatin (LIPITOR) 20 MG tablet Take 1 tablet (20 mg total) by mouth daily. 30  tablet 2   irbesartan (AVAPRO) 150 MG tablet TAKE 1 TABLET BY MOUTH EVERY DAY 30 tablet 0   irbesartan (AVAPRO) 75 MG tablet TAKE 1 TABLET BY MOUTH EVERY DAY (Patient not taking: Reported on 08/30/2022) 90 tablet 1   No facility-administered medications prior to visit.    No Known Allergies  Review of Systems As per HPI  PE:    08/30/2022   10:24 AM 05/24/2022    8:55 AM 05/24/2022    8:37 AM  Vitals with BMI  Height '6\' 0"'$   '6\' 0"'$   Weight 245 lbs 3 oz  258 lbs 10 oz  BMI [id number] [id number] 81 80 83  Pulse 56  59     Physical Exam  Gen: Alert, well appearing.  Patient is oriented to person, place, time, and situation. AFFECT: pleasant, lucid thought and speech. CV: RRR, no m/r/g.   LUNGS: CTA bilat, nonlabored resps, good aeration in all lung fields. EXT: no clubbing or cyanosis.  no edema.    LABS:  Last CBC Lab Results  Component Value Date   WBC 5.0 05/24/2022   HGB 13.7 05/24/2022   HCT 41.5 05/24/2022   MCV 89.6 05/24/2022   MCH 29.6 05/24/2022   RDW 13.7 05/24/2022   PLT 296 [id number] Last metabolic panel Lab Results  Component Value Date   GLUCOSE 97 05/24/2022   NA 142 05/24/2022   K 4.2 05/24/2022   CL 104 05/24/2022  CO2 27 05/24/2022   BUN 13 05/24/2022   CREATININE 0.82 05/24/2022   CALCIUM 8.9 05/24/2022   PROT 6.2 05/24/2022   ALBUMIN 4.1 06/29/2021   BILITOT 0.5 05/24/2022   ALKPHOS 53 06/29/2021   AST 14 05/24/2022   ALT 15 05/24/2022   Last lipids Lab Results  Component Value Date   CHOL 209 (H) 05/24/2022   HDL 60 05/24/2022   LDLCALC 124 (H) 05/24/2022   TRIG 130 05/24/2022   CHOLHDL 3.5 05/24/2022   Last thyroid functions Lab Results  Component Value Date   TSH 2.52 05/24/2022   IMPRESSION AND PLAN:  #1 hypertension, well-controlled on irbesartan 150 mg a day. Electrolytes and creatinine today.  2.  Hyperlipidemia, tolerating atorvastatin 20 mg a day. Recheck lipids today.  Goal LDL  less than 70.  An After Visit Summary was printed and given to the patient.  FOLLOW UP: Return in about 9 months (around 06/01/2023) for annual CPE (fasting).   Signed:  Crissie Sickles, MD           08/30/2022

## 2022-08-31 LAB — COMPREHENSIVE METABOLIC PANEL
AG Ratio: 1.9 (calc) (ref 1.0–2.5)
ALT: 17 U/L (ref 9–46)
AST: 18 U/L (ref 10–35)
Albumin: 4.1 g/dL (ref 3.6–5.1)
Alkaline phosphatase (APISO): 57 U/L (ref 35–144)
BUN: 16 mg/dL (ref 7–25)
CO2: 24 mmol/L (ref 20–32)
Calcium: 9.2 mg/dL (ref 8.6–10.3)
Chloride: 103 mmol/L (ref 98–110)
Creat: 0.79 mg/dL (ref 0.70–1.35)
Globulin: 2.2 g/dL (calc) (ref 1.9–3.7)
Glucose, Bld: 86 mg/dL (ref 65–99)
Potassium: 3.9 mmol/L (ref 3.5–5.3)
Sodium: 141 mmol/L (ref 135–146)
Total Bilirubin: 0.7 mg/dL (ref 0.2–1.2)
Total Protein: 6.3 g/dL (ref 6.1–8.1)

## 2022-08-31 LAB — LIPID PANEL
Cholesterol: 143 mg/dL (ref <200)
HDL: 64 mg/dL (ref >=40)
LDL Cholesterol (Calc): 64 mg/dL (calc)
Non-HDL Cholesterol (Calc): 79 mg/dL (calc) (ref <130)
Total CHOL/HDL Ratio: 2.2 (calc) (ref <5.0)
Triglycerides: 69 mg/dL (ref <150)

## 2022-09-02 DIAGNOSIS — L720 Epidermal cyst: Secondary | ICD-10-CM | POA: Diagnosis not present

## 2022-10-20 ENCOUNTER — Ambulatory Visit (INDEPENDENT_AMBULATORY_CARE_PROVIDER_SITE_OTHER): Payer: Medicare HMO

## 2022-10-20 VITALS — Wt 245.0 lb

## 2022-10-20 DIAGNOSIS — Z0000 Encounter for general adult medical examination without abnormal findings: Secondary | ICD-10-CM

## 2022-10-20 NOTE — Progress Notes (Addendum)
I connected with  Matthew Hess on 10/20/22 by a audio enabled telemedicine application and verified that I am speaking with the correct person using two identifiers.  Patient Location: Home  Provider Location: Home Office  I discussed the limitations of evaluation and management by telemedicine. The patient expressed understanding and agreed to proceed.   Subjective:   Matthew Hess is a 67 y.o. male who presents for Medicare Annual/Subsequent preventive examination.  Review of Systems     Cardiac Risk Factors include: advanced age (>32men, >45 women);male gender;obesity (BMI >30kg/m2)     Objective:    Today's Vitals   10/20/22 1403  Weight: 245 lb (111.1 kg)   Body mass index is 33.23 kg/m.     10/14/2021    2:27 PM 01/17/2017    2:28 PM  Advanced Directives  Does Patient Have a Medical Advance Directive? Yes No  Type of Advance Directive Living will     Current Medications (verified) Outpatient Encounter Medications as of 10/20/2022  Medication Sig   atorvastatin (LIPITOR) 20 MG tablet Take 1 tablet (20 mg total) by mouth daily.   irbesartan (AVAPRO) 150 MG tablet Take 1 tablet (150 mg total) by mouth daily.   Multiple Vitamin (ONE-A-DAY MENS PO) Take by mouth daily.   No facility-administered encounter medications on file as of 10/20/2022.    Allergies (verified) Patient has no known allergies.   History: Past Medical History:  Diagnosis Date   Diverticulitis 2005   recurrent->segmental colectomy   GERD (gastroesophageal reflux disease)    History of adenomatous polyp of colon    03/2021. 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  Rpt 2022-[facility]->polypectomy, recall 03/2024.   CORONARY CALCIUM  SCORE  10/2021   2023-->21st %'tile   Segmental colectomy  2008   d/t recurrent diverticulitis   TONSILLECTOMY AND ADENOIDECTOMY  2000   and uvulectomy   Family History  Problem Relation Age of Onset   Arthritis Mother    Hearing loss Mother    Diabetes Mother    Cancer Father    Depression Father    Mental illness Father    Arthritis Sister    Alcohol abuse Maternal Grandmother    Diabetes Paternal Grandmother    Alcohol abuse Paternal Grandfather    Mental illness Paternal Grandfather    Alcohol abuse Daughter    Depression Daughter    Social History   Socioeconomic History   Marital status: Married    Spouse name: Not on file   Number of children: Not on file   Years of education: Not on file   Highest education level: Not on file  Occupational History   Not on file  Tobacco Use   Smoking status: Never   Smokeless tobacco: Never  Substance and Sexual Activity   Alcohol use: Yes    Comment: occasionly    Drug use: Not on file   Sexual activity: Not Currently  Other Topics Concern   Not on file  Social History Narrative   Married, 2 children.   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   No tob.   Alcohol-->hx of liquor but quit this, ongoing beer about a case per week as  of 06/2021.   Social Determinants of Health   Financial Resource Strain: Low Risk  (10/20/2022)   Overall Financial Resource Strain (CARDIA)    Difficulty of Paying Living Expenses: Not hard at all  Food Insecurity: No Food Insecurity (10/20/2022)   Hunger Vital Sign    Worried About Running Out of Food in the Last Year: Never true    Ran Out of Food in the Last Year: Never true  Transportation Needs: No Transportation Needs (10/20/2022)   PRAPARE - Administrator, Civil Service (Medical): No    Lack of Transportation (Non-Medical): No  Physical Activity: Inactive (10/20/2022)   Exercise Vital Sign    Days of Exercise per Week: 0 days    Minutes of Exercise per Session: 0 min   Stress: No Stress Concern Present (10/20/2022)   [employer] of Occupational Health - Occupational Stress Questionnaire    Feeling of Stress : Not at all  Social Connections: Socially Integrated (10/20/2022)   Social Connection and Isolation Panel [NHANES]    Frequency of Communication with Friends and Family: More than three times a week    Frequency of Social Gatherings with Friends and Family: More than three times a week    Attends Religious Services: More than 4 times per year    Active Member of [employer] or Organizations: Yes    Attends Banker Meetings: 1 to 4 times per year    Marital Status: Married    Tobacco Counseling Counseling given: Not Answered   Clinical Intake:  Pre-visit preparation completed: Yes  Pain : No/denies pain     BMI - recorded: 33.23 Nutritional Status: BMI > 30  Obese Nutritional Risks: None Diabetes: No  How often do you need to have someone help you when you read instructions, pamphlets, or other written materials from your doctor or pharmacy?: 1 - Never  Diabetic?no  Interpreter Needed?: No  Information entered by :: Lanier Ensign, LPN   Activities of Daily Living    10/20/2022    2:08 PM  In your present state of health, do you have any difficulty performing the following activities:  Hearing? 0  Vision? 0  Difficulty concentrating or making decisions? 0  Walking or climbing stairs? 0  Dressing or bathing? 0  Doing errands, shopping? 0  Preparing Food and eating ? N  Using the Toilet? N  In the past six months, have you accidently leaked urine? N  Do you have problems with loss of bowel control? N  Managing your Medications? N  Managing your Finances? N  Housekeeping or managing your Housekeeping? N    Patient Care Team: Jeoffrey Massed, MD as PCP - General (Family Medicine) Tilda Burrow, Lollie Marrow, MD as Consulting Physician (Gastroenterology)  Indicate any recent Medical Services you may have received from  other than [facility] providers in the past year (date may be approximate).     Assessment:   This is a routine wellness examination for Matthew Hess.  Hearing/Vision screen Hearing Screening - Comments:: Pt denies any hearing issues  Vision Screening - Comments:: Pt follows up [facility] for annual eye exams   Dietary issues and exercise activities discussed: Current Exercise Habits: The patient does not participate in regular exercise at present   Goals Addressed             This Visit's Progress    Patient Stated       Stay healthy        Depression Screen  10/20/2022    2:06 PM 05/24/2022    8:38 AM 10/14/2021    2:26 PM 06/29/2021    1:57 PM 01/17/2017    2:30 PM  PHQ 2/9 Scores  PHQ - 2 Score 0 0 0 0 0  PHQ- 9 Score     0    Fall Risk    10/20/2022    2:08 PM 05/24/2022    8:39 AM 10/22/2021    8:38 AM 10/14/2021    2:28 PM 06/29/2021    1:58 PM  Fall Risk   Falls in the past year? 0 0 1 1 1   Number falls in past yr: 0 0 1 1 0  Injury with Fall? 0 0 0 0 1  Risk for fall due to : Impaired vision Impaired vision Impaired vision Impaired vision   Follow up Falls prevention discussed Falls evaluation completed Falls evaluation completed Falls prevention discussed Falls evaluation completed    FALL RISK PREVENTION PERTAINING TO THE HOME:  Any stairs in or around the home? Yes  If so, are there any without handrails? No  Home free of loose throw rugs in walkways, pet beds, electrical cords, etc? Yes  Adequate lighting in your home to reduce risk of falls? Yes   ASSISTIVE DEVICES UTILIZED TO PREVENT FALLS:  Life alert? No  Use of a cane, walker or w/c? No  Grab bars in the bathroom? No  Shower chair or bench in shower? No  Elevated toilet seat or a handicapped toilet? No   TIMED UP AND GO:  Was the test performed? No .   Cognitive Function:        10/20/2022    2:08 PM 10/14/2021    2:30 PM  6CIT Screen  What Year? 0 points 0 points  What month? 0 points 0 points   What time? 0 points   Count back from 20 0 points   Months in reverse 0 points   Repeat phrase 0 points   Total Score 0 points     Immunizations Immunization History  Administered Date(s) Administered   Influenza-Unspecified 06/21/2008   PFIZER(Purple Top)SARS-COV-2 Vaccination 01/04/2020, 01/26/2020   PNEUMOCOCCAL CONJUGATE-20 06/29/2021   Tdap 06/21/2008, 09/30/2021   Zoster Recombinat (Shingrix) 09/30/2021    TDAP status: Up to date  Flu Vaccine status: Up to date  Pneumococcal vaccine status: Up to date  Covid-19 vaccine status: Completed vaccines  Qualifies for Shingles Vaccine? Yes   Zostavax completed Yes   Shingrix Completed?: No.    Education has been provided regarding the importance of this vaccine. Patient has been advised to call insurance company to determine out of pocket expense if they have not yet received this vaccine. Advised may also receive vaccine at local pharmacy or Health Dept. Verbalized acceptance and understanding.  Screening Tests Health Maintenance  Topic Date Due   COVID-19 Vaccine (3 - 2023-24 season) 02/19/2022   Zoster Vaccines- Shingrix (2 of 2) 11/30/2022 (Originally 11/25/2021)   Hepatitis C Screening  08/30/2023 (Originally 08/27/1973)   INFLUENZA VACCINE  01/20/2023   Medicare Annual Wellness (AWV)  10/20/2023   COLONOSCOPY (Pts 45-44yrs Insurance coverage will need to be confirmed)  04/13/2026   DTaP/Tdap/Td (3 - Td or Tdap) 10/01/2031   Pneumonia Vaccine 11+ Years old  Completed   HPV VACCINES  Aged Out    Health Maintenance  Health Maintenance Due  Topic Date Due   COVID-19 Vaccine (3 - 2023-24 season) 02/19/2022    Colorectal cancer screening: Type of screening:  Colonoscopy. Completed 04/13/21. Repeat every 5 years   Additional Screening:  Hepatitis C Screening: does qualify  Vision Screening: Recommended annual ophthalmology exams for early detection of glaucoma and other disorders of the eye. Is the patient up to  date with their annual eye exam?  Yes  Who is the provider or what is the name of the office in which the patient attends annual eye exams? [facility]  If pt is not established with a provider, would they like to be referred to a provider to establish care? No .   Dental Screening: Recommended annual dental exams for proper oral hygiene  Community Resource Referral / Chronic Care Management: CRR required this visit?  No   CCM required this visit?  No      Plan:     I have personally reviewed and noted the following in the patient's chart:   Medical and social history Use of alcohol, tobacco or illicit drugs  Current medications and supplements including opioid prescriptions. Patient is not currently taking opioid prescriptions. Functional ability and status Nutritional status Physical activity Advanced directives List of other physicians Hospitalizations, surgeries, and ER visits in previous 12 months Vitals Screenings to include cognitive, depression, and falls Referrals and appointments  In addition, I have reviewed and discussed with patient certain preventive protocols, quality metrics, and best practice recommendations. A written personalized care plan for preventive services as well as general preventive health recommendations were provided to patient.     Marzella Schlein, LPN   11/21/1306   Nurse Notes: none

## 2022-10-20 NOTE — Patient Instructions (Signed)
Matthew Hess , Thank you for taking time to come for your Medicare Wellness Visit. I appreciate your ongoing commitment to your health goals. Please review the following plan we discussed and let me know if I can assist you in the future.   These are the goals we discussed:  Goals      Patient Stated     None at this time      Patient Stated     Stay healthy         This is a list of the screening recommended for you and due dates:  Health Maintenance  Topic Date Due   COVID-19 Vaccine (3 - 2023-24 season) 02/19/2022   Zoster (Shingles) Vaccine (2 of 2) 11/30/2022*   Hepatitis C Screening: USPSTF Recommendation to screen - Ages 18-79 yo.  08/30/2023*   Flu Shot  01/20/2023   Medicare Annual Wellness Visit  10/20/2023   Colon Cancer Screening  04/13/2026   DTaP/Tdap/Td vaccine (3 - Td or Tdap) 10/01/2031   Pneumonia Vaccine  Completed   HPV Vaccine  Aged Out  *Topic was postponed. The date shown is not the original due date.    Advanced directives: Please bring a copy of your health care power of attorney and living will to the office at your convenience.  Conditions/risks identified: stay healthy  Next appointment: Follow up in one year for your annual wellness visit.   Preventive Care 68 Years and Older, Male  Preventive care refers to lifestyle choices and visits with your health care provider that can promote health and wellness. What does preventive care include? A yearly physical exam. This is also called an annual well check. Dental exams once or twice a year. Routine eye exams. Ask your health care provider how often you should have your eyes checked. Personal lifestyle choices, including: Daily care of your teeth and gums. Regular physical activity. Eating a healthy diet. Avoiding tobacco and drug use. Limiting alcohol use. Practicing safe sex. Taking low doses of aspirin every day. Taking vitamin and mineral supplements as recommended by your health care  provider. What happens during an annual well check? The services and screenings done by your health care provider during your annual well check will depend on your age, overall health, lifestyle risk factors, and family history of disease. Counseling  Your health care provider may ask you questions about your: Alcohol use. Tobacco use. Drug use. Emotional well-being. Home and relationship well-being. Sexual activity. Eating habits. History of falls. Memory and ability to understand (cognition). Work and work Astronomer. Screening  You may have the following tests or measurements: Height, weight, and BMI. Blood pressure. Lipid and cholesterol levels. These may be checked every 5 years, or more frequently if you are over 48 years old. Skin check. Lung cancer screening. You may have this screening every year starting at age 68 if you have a 30-pack-year history of smoking and currently smoke or have quit within the past 15 years. Fecal occult blood test (FOBT) of the stool. You may have this test every year starting at age 68. Flexible sigmoidoscopy or colonoscopy. You may have a sigmoidoscopy every 5 years or a colonoscopy every 10 years starting at age 72. Prostate cancer screening. Recommendations will vary depending on your family history and other risks. Hepatitis C blood test. Hepatitis B blood test. Sexually transmitted disease (STD) testing. Diabetes screening. This is done by checking your blood sugar (glucose) after you have not eaten for a while (fasting). You may  have this done every 1-3 years. Abdominal aortic aneurysm (AAA) screening. You may need this if you are a current or former smoker. Osteoporosis. You may be screened starting at age 38 if you are at high risk. Talk with your health care provider about your test results, treatment options, and if necessary, the need for more tests. Vaccines  Your health care provider may recommend certain vaccines, such  as: Influenza vaccine. This is recommended every year. Tetanus, diphtheria, and acellular pertussis (Tdap, Td) vaccine. You may need a Td booster every 10 years. Zoster vaccine. You may need this after age 86. Pneumococcal 13-valent conjugate (PCV13) vaccine. One dose is recommended after age 68. Pneumococcal polysaccharide (PPSV23) vaccine. One dose is recommended after age 68. Talk to your health care provider about which screenings and vaccines you need and how often you need them. This information is not intended to replace advice given to you by your health care provider. Make sure you discuss any questions you have with your health care provider. Document Released: 07/04/2015 Document Revised: 02/25/2016 Document Reviewed: 04/08/2015 Elsevier Interactive Patient Education  2017 [place] Prevention in the Home Falls can cause injuries. They can happen to people of all ages. There are many things you can do to make your home safe and to help prevent falls. What can I do on the outside of my home? Regularly fix the edges of walkways and driveways and fix any cracks. Remove anything that might make you trip as you walk through a door, such as a raised step or threshold. Trim any bushes or trees on the path to your home. Use bright outdoor lighting. Clear any walking paths of anything that might make someone trip, such as rocks or tools. Regularly check to see if handrails are loose or broken. Make sure that both sides of any steps have handrails. Any raised decks and porches should have guardrails on the edges. Have any leaves, snow, or ice cleared regularly. Use sand or salt on walking paths during winter. Clean up any spills in your garage right away. This includes oil or grease spills. What can I do in the bathroom? Use night lights. Install grab bars by the toilet and in the tub and shower. Do not use towel bars as grab bars. Use non-skid mats or decals in the tub or  shower. If you need to sit down in the shower, use a plastic, non-slip stool. Keep the floor dry. Clean up any water that spills on the floor as soon as it happens. Remove soap buildup in the tub or shower regularly. Attach bath mats securely with double-sided non-slip rug tape. Do not have throw rugs and other things on the floor that can make you trip. What can I do in the bedroom? Use night lights. Make sure that you have a light by your bed that is easy to reach. Do not use any sheets or blankets that are too big for your bed. They should not hang down onto the floor. Have a firm chair that has side arms. You can use this for support while you get dressed. Do not have throw rugs and other things on the floor that can make you trip. What can I do in the kitchen? Clean up any spills right away. Avoid walking on wet floors. Keep items that you use a lot in easy-to-reach places. If you need to reach something above you, use a strong step stool that has a grab bar. Keep electrical cords  out of the way. Do not use floor polish or wax that makes floors slippery. If you must use wax, use non-skid floor wax. Do not have throw rugs and other things on the floor that can make you trip. What can I do with my stairs? Do not leave any items on the stairs. Make sure that there are handrails on both sides of the stairs and use them. Fix handrails that are broken or loose. Make sure that handrails are as long as the stairways. Check any carpeting to make sure that it is firmly attached to the stairs. Fix any carpet that is loose or worn. Avoid having throw rugs at the top or bottom of the stairs. If you do have throw rugs, attach them to the floor with carpet tape. Make sure that you have a light switch at the top of the stairs and the bottom of the stairs. If you do not have them, ask someone to add them for you. What else can I do to help prevent falls? Wear shoes that: Do not have high heels. Have  rubber bottoms. Are comfortable and fit you well. Are closed at the toe. Do not wear sandals. If you use a stepladder: Make sure that it is fully opened. Do not climb a closed stepladder. Make sure that both sides of the stepladder are locked into place. Ask someone to hold it for you, if possible. Clearly mark and make sure that you can see: Any grab bars or handrails. First and last steps. Where the edge of each step is. Use tools that help you move around (mobility aids) if they are needed. These include: Canes. Walkers. Scooters. Crutches. Turn on the lights when you go into a dark area. Replace any light bulbs as soon as they burn out. Set up your furniture so you have a clear path. Avoid moving your furniture around. If any of your floors are uneven, fix them. If there are any pets around you, be aware of where they are. Review your medicines with your doctor. Some medicines can make you feel dizzy. This can increase your chance of falling. Ask your doctor what other things that you can do to help prevent falls. This information is not intended to replace advice given to you by your health care provider. Make sure you discuss any questions you have with your health care provider. Document Released: 04/03/2009 Document Revised: 11/13/2015 Document Reviewed: 07/12/2014 Elsevier Interactive Patient Education  2017 [employer].

## 2023-02-09 DIAGNOSIS — L578 Other skin changes due to chronic exposure to nonionizing radiation: Secondary | ICD-10-CM | POA: Diagnosis not present

## 2023-02-09 DIAGNOSIS — L821 Other seborrheic keratosis: Secondary | ICD-10-CM | POA: Diagnosis not present

## 2023-02-09 DIAGNOSIS — C44319 Basal cell carcinoma of skin of other parts of face: Secondary | ICD-10-CM | POA: Diagnosis not present

## 2023-02-09 DIAGNOSIS — L814 Other melanin hyperpigmentation: Secondary | ICD-10-CM | POA: Diagnosis not present

## 2023-03-15 ENCOUNTER — Other Ambulatory Visit: Payer: Self-pay | Admitting: Family Medicine

## 2023-05-03 DIAGNOSIS — D485 Neoplasm of uncertain behavior of skin: Secondary | ICD-10-CM | POA: Diagnosis not present

## 2023-05-16 DIAGNOSIS — C44722 Squamous cell carcinoma of skin of right lower limb, including hip: Secondary | ICD-10-CM | POA: Diagnosis not present

## 2023-05-27 ENCOUNTER — Other Ambulatory Visit: Payer: Self-pay | Admitting: Family Medicine

## 2023-06-01 ENCOUNTER — Encounter: Payer: Self-pay | Admitting: Family Medicine

## 2023-06-01 ENCOUNTER — Ambulatory Visit: Payer: Medicare HMO | Admitting: Family Medicine

## 2023-06-01 VITALS — BP 123/78 | HR 64 | Ht 72.0 in | Wt 252.0 lb

## 2023-06-01 DIAGNOSIS — N5201 Erectile dysfunction due to arterial insufficiency: Secondary | ICD-10-CM

## 2023-06-01 DIAGNOSIS — E7800 Pure hypercholesterolemia, unspecified: Secondary | ICD-10-CM

## 2023-06-01 DIAGNOSIS — I10 Essential (primary) hypertension: Secondary | ICD-10-CM | POA: Diagnosis not present

## 2023-06-01 DIAGNOSIS — Z0000 Encounter for general adult medical examination without abnormal findings: Secondary | ICD-10-CM

## 2023-06-01 DIAGNOSIS — Z125 Encounter for screening for malignant neoplasm of prostate: Secondary | ICD-10-CM | POA: Diagnosis not present

## 2023-06-01 LAB — COMPREHENSIVE METABOLIC PANEL
ALT: 16 U/L (ref 0–53)
AST: 15 U/L (ref 0–37)
Albumin: 4.2 g/dL (ref 3.5–5.2)
Alkaline Phosphatase: 62 U/L (ref 39–117)
BUN: 17 mg/dL (ref 6–23)
CO2: 32 meq/L (ref 19–32)
Calcium: 9.3 mg/dL (ref 8.4–10.5)
Chloride: 102 meq/L (ref 96–112)
Creatinine, Ser: 0.87 mg/dL (ref 0.40–1.50)
GFR: 88.51 mL/min (ref >60.00)
Glucose, Bld: 90 mg/dL (ref 70–99)
Potassium: 4.3 meq/L (ref 3.5–5.1)
Sodium: 141 meq/L (ref 135–145)
Total Bilirubin: 0.7 mg/dL (ref 0.2–1.2)
Total Protein: 6.3 g/dL (ref 6.0–8.3)

## 2023-06-01 LAB — LIPID PANEL
Cholesterol: 174 mg/dL (ref 0–200)
HDL: 60.2 mg/dL (ref >39.00)
LDL Cholesterol: 87 mg/dL (ref 0–99)
NonHDL: 113.83
Total CHOL/HDL Ratio: 3
Triglycerides: 133 mg/dL (ref 0.0–149.0)
VLDL: 26.6 mg/dL (ref 0.0–40.0)

## 2023-06-01 LAB — CBC
HCT: 41.9 % (ref 39.0–52.0)
Hemoglobin: 13.7 g/dL (ref 13.0–17.0)
MCHC: 32.6 g/dL (ref 30.0–36.0)
MCV: 91.9 fL (ref 78.0–100.0)
Platelets: 286 10*3/uL (ref 150.0–400.0)
RBC: 4.57 Mil/uL (ref 4.22–5.81)
RDW: 14.1 % (ref 11.5–15.5)
WBC: 5.8 10*3/uL (ref 4.0–10.5)

## 2023-06-01 LAB — PSA, MEDICARE: PSA: 2.28 ng/mL (ref 0.10–4.00)

## 2023-06-01 MED ORDER — IRBESARTAN 150 MG PO TABS: 150.0000 mg | ORAL_TABLET | Freq: Every day | ORAL | 1 refills | Status: DC

## 2023-06-01 NOTE — Progress Notes (Signed)
Office Note 06/01/2023  CC:  Chief Complaint  Patient presents with   Annual Exam    Pt is not fasting.    Patient is a 67 y.o. male who is here for annual health maintenance exam and 75-month follow-up hypertension and hyperlipidemia. A/P as of last visit: "1 hypertension, well-controlled on irbesartan 150 mg a day. Electrolytes and creatinine today.   2.  Hyperlipidemia, tolerating atorvastatin 20 mg a day. Recheck lipids today.  Goal LDL less than 70."  INTERIM HX: Labs excellent last visit.  Matthew Hess is feeling well. He has some long-term erectile dysfunction that he would like to use Viagra for.  He currently does not have a prescription but is unsure about insurance coverage.  Check blood pressure at home last night it was 150 systolic.  He does not have any other measurements from recent months.  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   Past Medical History:  Diagnosis Date   Diverticulitis 2005   recurrent->segmental colectomy   GERD (gastroesophageal reflux disease)    History of adenomatous polyp of colon    03/2021. 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  Rpt 2022-[facility]->polypectomy, recall 03/2024.   CORONARY CALCIUM SCORE  10/2021   2023-->21st %'tile   Segmental colectomy  2008   d/t recurrent diverticulitis   TONSILLECTOMY AND ADENOIDECTOMY  2000   and uvulectomy    Family History  Problem Relation Age of Onset   Arthritis Mother    Hearing loss Mother    Diabetes Mother    Cancer Father    Depression Father    Mental illness Father    Arthritis Sister    Alcohol abuse Maternal Grandmother    Diabetes Paternal Grandmother    Alcohol abuse Paternal Grandfather     Mental illness Paternal Grandfather    Alcohol abuse Daughter    Depression Daughter     Social History   Socioeconomic History   Marital status: Married    Spouse name: Not on file   Number of children: Not on file   Years of education: Not on file   Highest education level: Not on file  Occupational History   Not on file  Tobacco Use   Smoking status: Never   Smokeless tobacco: Never  Substance and Sexual Activity   Alcohol use: Yes    Comment: occasionly    Drug use: Not on file   Sexual activity: Not Currently  Other Topics Concern   Not on file  Social History Narrative   Married, 2 children.   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   No tob.   Alcohol-->hx of liquor but quit this, ongoing beer about a case per week as of 06/2021.   Social Determinants of Health   Financial Resource Strain: Low Risk  (10/20/2022)   Overall Financial Resource Strain (CARDIA)    Difficulty of Paying Living Expenses: Not hard at all  Food Insecurity: No Food Insecurity (10/20/2022)   Hunger Vital Sign    Worried About Running Out of Food in the Last Year: Never true    Ran Out of Food in the Last Year: Never true  Transportation  Needs: No Transportation Needs (10/20/2022)   PRAPARE - Administrator, Civil Service (Medical): No    Lack of Transportation (Non-Medical): No  Physical Activity: Inactive (10/20/2022)   Exercise Vital Sign    Days of Exercise per Week: 0 days    Minutes of Exercise per Session: 0 min  Stress: No Stress Concern Present (10/20/2022)   [employer] of Occupational Health - Occupational Stress Questionnaire    Feeling of Stress : Not at all  Social Connections: Socially Integrated (10/20/2022)   Social Connection and Isolation Panel [NHANES]    Frequency of Communication with Friends and Family: More than three times a week    Frequency of Social Gatherings with Friends and Family: More than three times a week    Attends Religious Services:  More than 4 times per year    Active Member of [employer] or Organizations: Yes    Attends Banker Meetings: 1 to 4 times per year    Marital Status: Married  Catering manager Violence: Not At Risk (10/20/2022)   Humiliation, Afraid, Rape, and Kick questionnaire    Fear of Current or Ex-Partner: No    Emotionally Abused: No    Physically Abused: No    Sexually Abused: No    Outpatient Medications Prior to Visit  Medication Sig Dispense Refill   atorvastatin (LIPITOR) 20 MG tablet TAKE 1 TABLET BY MOUTH EVERY DAY 30 tablet 0   irbesartan (AVAPRO) 150 MG tablet TAKE 1 TABLET BY MOUTH EVERY DAY 90 tablet 0   Multiple Vitamin (ONE-A-DAY MENS PO) Take by mouth daily. (Patient not taking: Reported on 06/01/2023)     No facility-administered medications prior to visit.    No Known Allergies  Review of Systems  Constitutional:  Negative for appetite change, chills, fatigue and fever.  HENT:  Negative for congestion, dental problem, ear pain and sore throat.   Eyes:  Negative for discharge, redness and visual disturbance.  Respiratory:  Negative for cough, chest tightness, shortness of breath and wheezing.   Cardiovascular:  Negative for chest pain, palpitations and leg swelling.  Gastrointestinal:  Negative for abdominal pain, blood in stool, diarrhea, nausea and vomiting.  Genitourinary:  Negative for difficulty urinating, dysuria, flank pain, frequency, hematuria and urgency.  Musculoskeletal:  Negative for arthralgias, back pain, joint swelling, myalgias and neck stiffness.  Skin:  Negative for pallor and rash.  Neurological:  Negative for dizziness, speech difficulty, weakness and headaches.  Hematological:  Negative for adenopathy. Does not bruise/bleed easily.  Psychiatric/Behavioral:  Negative for confusion and sleep disturbance. The patient is not nervous/anxious.     PE;    06/01/2023    8:01 AM 10/20/2022    2:03 PM 08/30/2022   10:24 AM  Vitals with BMI  Height 6'  0"  6\' 0"   Weight 252 lbs 245 lbs 245 lbs 3 oz  BMI 34.17  33.25  Systolic 123  128  Diastolic 78  81  Pulse 64  56   Gen: Alert, well appearing.  Patient is oriented to person, place, time, and situation. AFFECT: pleasant, lucid thought and speech. ENT: Ears: EACs clear, normal epithelium.  TMs with good light reflex and landmarks bilaterally.  Eyes: no injection, icteris, swelling, or exudate.  EOMI, PERRLA. Nose: no drainage or turbinate edema/swelling.  No injection or focal lesion.  Mouth: lips without lesion/swelling.  Oral mucosa pink and moist.  Dentition intact and without obvious caries or gingival swelling.  Oropharynx without erythema, exudate, or  swelling.  Neck: supple/nontender.  No LAD, mass, or TM.  Carotid pulses 2+ bilaterally, without bruits. CV: RRR, no m/r/g.   LUNGS: CTA bilat, nonlabored resps, good aeration in all lung fields. ABD: soft, NT, ND, BS normal.  No hepatospenomegaly or mass.  No bruits. EXT: no clubbing, cyanosis, or edema.  Musculoskeletal: no joint swelling, erythema, warmth, or tenderness.  ROM of all joints intact. Skin - no sores or suspicious lesions or rashes or color changes  Pertinent labs:  Lab Results  Component Value Date   TSH 2.52 05/24/2022   Lab Results  Component Value Date   WBC 5.0 05/24/2022   HGB 13.7 05/24/2022   HCT 41.5 05/24/2022   MCV 89.6 05/24/2022   PLT 296 05/24/2022   Lab Results  Component Value Date   CREATININE 0.79 08/30/2022   BUN 16 08/30/2022   NA 141 08/30/2022   K 3.9 08/30/2022   CL 103 08/30/2022   CO2 24 08/30/2022   Lab Results  Component Value Date   ALT 17 08/30/2022   AST 18 08/30/2022   ALKPHOS 53 06/29/2021   BILITOT 0.7 08/30/2022   Lab Results  Component Value Date   CHOL 143 08/30/2022   Lab Results  Component Value Date   HDL 64 08/30/2022   Lab Results  Component Value Date   LDLCALC 64 08/30/2022   Lab Results  Component Value Date   TRIG 69 08/30/2022   Lab  Results  Component Value Date   CHOLHDL 2.2 08/30/2022   Lab Results  Component Value Date   PSA 1.35 05/24/2022   PSA 2.10 06/29/2021    ASSESSMENT AND PLAN:   #1 health maintenance exam: Reviewed age and gender appropriate health maintenance issues (prudent diet, regular exercise, health risks of tobacco and excessive alcohol, use of seatbelts, fire alarms in home, use of sunscreen).  Also reviewed age and gender appropriate health screening as well as vaccine recommendations. Vaccines: Flu->pt declined. Labs: fasting HP + PSA Prostate ca screening: PSA today Colon ca screening: recall 2025  #2 hypertension, well-controlled on the irbesartan 150 mg a day. Electrolytes and creatinine today.  3.  Hypercholesterolemia, doing well on a atorvastatin 20 mg a day. He did have creamer in his coffee today. Check lipid panel and hepatic panel today.  #4 erectile dysfunction. Sildenafil has worked in the past but his insurer did not cover it. He is going to be changing insurances over the next 4 to 6 weeks so he will give [place] a call to request this medication again when he is switched.  An After Visit Summary was printed and given to the patient.  FOLLOW UP:  No follow-ups on file.  Signed:  Santiago Bumpers, MD           06/01/2023

## 2023-06-21 ENCOUNTER — Other Ambulatory Visit: Payer: Self-pay | Admitting: Family Medicine

## 2023-08-18 DIAGNOSIS — R233 Spontaneous ecchymoses: Secondary | ICD-10-CM | POA: Diagnosis not present

## 2023-08-18 DIAGNOSIS — L578 Other skin changes due to chronic exposure to nonionizing radiation: Secondary | ICD-10-CM | POA: Diagnosis not present

## 2023-08-18 DIAGNOSIS — C44722 Squamous cell carcinoma of skin of right lower limb, including hip: Secondary | ICD-10-CM | POA: Diagnosis not present

## 2023-10-21 ENCOUNTER — Other Ambulatory Visit: Payer: Self-pay | Admitting: Family Medicine

## 2023-11-03 ENCOUNTER — Ambulatory Visit: Payer: Self-pay

## 2023-11-03 NOTE — Telephone Encounter (Signed)
  Chief Complaint: hypertension Symptoms: hypertension Frequency: x 1 day Pertinent Negatives: Patient denies chest pain, difficulty breathing, unilateral numbness or weakness, dizziness, headaches, blurred vision, loss of vision. Disposition: [] ED /[] Urgent Care (no appt availability in office) / [x] Appointment(In office/virtual)/ []  [facility]/ [] Home Care/ [] Refused Recommended Disposition /[] [facility]/ []  Follow-up with PCP Additional Notes: Patient states he has been using his CPAP nightly and it broke about 4 days ago. He states he thinks that is related to his hypertension. He denies missing any doses of his BP med. Offered appt with PCP tomorrow afternoon and patient declined, offered morning appt with PA Whitney and patient declined. Patient prefers morning appt with PCP.  Copied from CRM [id number]. Topic: Clinical - Red Word Triage >> Nov 03, 2023  8:03 AM Kita Perish H wrote: [facility] that prompted transfer to Nurse Triage: Patient is having high blood pressure and cpap is broken Reason for Disposition  Systolic BP  >= 160 OR Diastolic >= 100  Answer Assessment - Initial Assessment Questions 1. BLOOD PRESSURE: "What is the blood pressure?" "Did you take at least two measurements 5 minutes apart?"     152/101  2. ONSET: "When did you take your blood pressure?"     Yesterday morning.  3. HOW: "How did you take your blood pressure?" (e.g., automatic home BP monitor, visiting nurse)    Visiting nurse  4. HISTORY: "Do you have a history of high blood pressure?"     Yes.  5. MEDICINES: "Are you taking any medicines for blood pressure?" "Have you missed any doses recently?"     Irbesartan . Denies missing any doses.  6. OTHER SYMPTOMS: "Do you have any symptoms?" (e.g., blurred vision, chest pain, difficulty breathing, headache, weakness)     Denies.  7. PREGNANCY: "Is there any chance you are pregnant?" "When was your last menstrual period?"      N/A.  Protocols used: Blood Pressure - High-A-AH

## 2023-11-03 NOTE — Telephone Encounter (Signed)
 LVM for pt to return call. Provider has reviewed message and stated unless he would prefer to wait until Monday, he could see Whitney tomorrow in available opening.

## 2023-11-03 NOTE — Telephone Encounter (Signed)
 Spoke with patient regarding results/recommendations.

## 2023-11-07 ENCOUNTER — Ambulatory Visit (INDEPENDENT_AMBULATORY_CARE_PROVIDER_SITE_OTHER): Payer: Medicare (Managed Care) | Admitting: Family Medicine

## 2023-11-07 ENCOUNTER — Encounter: Payer: Self-pay | Admitting: Family Medicine

## 2023-11-07 VITALS — BP 126/81 | HR 68 | Temp 98.0°F | Ht 72.0 in | Wt 251.8 lb

## 2023-11-07 DIAGNOSIS — E7800 Pure hypercholesterolemia, unspecified: Secondary | ICD-10-CM | POA: Diagnosis not present

## 2023-11-07 DIAGNOSIS — I10 Essential (primary) hypertension: Secondary | ICD-10-CM | POA: Diagnosis not present

## 2023-11-07 DIAGNOSIS — G4733 Obstructive sleep apnea (adult) (pediatric): Secondary | ICD-10-CM | POA: Diagnosis not present

## 2023-11-07 LAB — LIPID PANEL
Cholesterol: 146 mg/dL (ref 0–200)
HDL: 53.7 mg/dL (ref >39.00)
LDL Cholesterol: 76 mg/dL (ref 0–99)
NonHDL: 91.8
Total CHOL/HDL Ratio: 3
Triglycerides: 81 mg/dL (ref 0.0–149.0)
VLDL: 16.2 mg/dL (ref 0.0–40.0)

## 2023-11-07 LAB — BASIC METABOLIC PANEL WITH GFR
BUN: 15 mg/dL (ref 6–23)
CO2: 27 meq/L (ref 19–32)
Calcium: 8.1 mg/dL — ABNORMAL LOW (ref 8.4–10.5)
Chloride: 103 meq/L (ref 96–112)
Creatinine, Ser: 0.7 mg/dL (ref 0.40–1.50)
GFR: 94.22 mL/min (ref >60.00)
Glucose, Bld: 103 mg/dL — ABNORMAL HIGH (ref 70–99)
Potassium: 3.9 meq/L (ref 3.5–5.1)
Sodium: 140 meq/L (ref 135–145)

## 2023-11-07 NOTE — Progress Notes (Signed)
 OFFICE VISIT  11/07/2023  CC:  Chief Complaint  Patient presents with   Hypertension    Last home BP reading was 152/101    Patient is a 68 y.o. male who presents for 29-month follow-up hypertension and hypercholesterolemia. A/P as of last visit: "1 hypertension, well-controlled on the irbesartan  150 mg a day. Electrolytes and creatinine today.   2.  Hypercholesterolemia, doing well on a atorvastatin  20 mg a day. He did have creamer in his coffee today. Check lipid panel and hepatic panel today.   #3 erectile dysfunction. Sildenafil  has worked in the past but his insurer did not cover it. He is going to be changing insurances over the next 4 to 6 weeks so he will give us  a call to request this medication again when he is switched."  INTERIM HX: Daemian feels well. He got diagnosed with sleep apnea a couple of decades ago.  He purchased a CPAP machine at that time.  He says it broke about a week ago. He does not have a sleep MD. He got a home visit via his insurer last week and his blood pressures was 150/100 at that time. He does not monitor his blood pressure at home but he does have a cuff.  ROS --> no fevers, no CP, no SOB, no wheezing, no cough, no dizziness, no HAs, no rashes, no melena/hematochezia.  No polyuria or polydipsia.  No myalgias or arthralgias.  No focal weakness, paresthesias, or tremors.  No acute vision or hearing abnormalities.  No dysuria or unusual/new urinary urgency or frequency.  No recent changes in lower legs. No n/v/d or abd pain.  No palpitations.      Past Medical History:  Diagnosis Date   Diverticulitis 2005   recurrent->segmental colectomy   Erectile dysfunction    GERD (gastroesophageal reflux disease)    History of adenomatous polyp of colon    03/2021. 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  Rpt 2022-[facility]->polypectomy, recall 03/2024.   CORONARY CALCIUM  SCORE  10/2021   2023-->21st %'tile   Segmental colectomy  2008   d/t recurrent diverticulitis   TONSILLECTOMY AND ADENOIDECTOMY  2000   and uvulectomy    Outpatient Medications Prior to Visit  Medication Sig Dispense Refill   atorvastatin  (LIPITOR) 20 MG tablet TAKE 1 TABLET BY MOUTH EVERY DAY 90 tablet 1   irbesartan  (AVAPRO ) 150 MG tablet Take 1 tablet (150 mg total) by mouth daily. 90 tablet 1   Multiple Vitamin (ONE-A-DAY MENS PO) Take by mouth daily. (Patient not taking: Reported on 06/01/2023)     No facility-administered medications prior to visit.    No Known Allergies  Review of Systems As per HPI  PE:    11/07/2023    8:26 AM 06/01/2023    8:01 AM 10/20/2022    2:03 PM  Vitals with BMI  Height 6\' 0"  6\' 0"    Weight 251 lbs 13 oz 252 lbs 245 lbs  BMI 34.14 34.17   Systolic 126 123   Diastolic 81 78   Pulse 68 64      Physical Exam  Gen: Alert, well appearing.  Patient is oriented to person, place, time, and situation.  AFFECT: pleasant, lucid thought and speech. CV: RRR, no m/r/g.   LUNGS: CTA bilat, nonlabored resps, good aeration in all lung fields. EXT: no clubbing or cyanosis.  no edema.    LABS:  Last CBC Lab Results  Component Value Date   WBC 5.8 06/01/2023   HGB 13.7 06/01/2023   HCT 41.9 06/01/2023   MCV 91.9 06/01/2023   MCH 29.6 05/24/2022   RDW 14.1 06/01/2023   PLT 286.0 06/01/2023   Last metabolic panel Lab Results  Component Value Date   GLUCOSE 90 06/01/2023   NA 141 06/01/2023   K 4.3 06/01/2023   CL 102 06/01/2023   CO2 32 06/01/2023   BUN 17 06/01/2023   CREATININE 0.87 06/01/2023   GFR 88.51 06/01/2023   CALCIUM  9.3 06/01/2023   PROT 6.3 06/01/2023   ALBUMIN 4.2 06/01/2023   BILITOT 0.7 06/01/2023   ALKPHOS 62 06/01/2023   AST 15  06/01/2023   ALT 16 06/01/2023   Last lipids Lab Results  Component Value Date   CHOL 174 06/01/2023   HDL 60.20 06/01/2023   LDLCALC 87 06/01/2023   TRIG 133.0 06/01/2023   CHOLHDL 3 06/01/2023   Last thyroid functions Lab Results  Component Value Date   TSH 2.52 05/24/2022   IMPRESSION AND PLAN:  1 hypertension, well-controlled per our office measurements on the irbesartan  150 mg a day. Home visit blood pressure last week 150/100. Encouraged him to get his blood pressure cuff out at home and check blood pressure regularly this week and call if consistently greater than 135/85. Electrolytes and creatinine today.   2.  Hypercholesterolemia, doing well on a atorvastatin  20 mg a day. He did have creamer in his coffee today. Check lipid panel today.  An After Visit Summary was printed and given to the patient.  FOLLOW UP: No follow-ups on file.  Signed:  Arletha Lady, MD           11/07/2023

## 2023-11-08 ENCOUNTER — Ambulatory Visit: Payer: Self-pay | Admitting: Family Medicine

## 2023-11-29 ENCOUNTER — Telehealth: Payer: Self-pay

## 2023-11-29 ENCOUNTER — Ambulatory Visit: Payer: Medicare (Managed Care) | Admitting: Sleep Medicine

## 2023-11-29 ENCOUNTER — Encounter: Payer: Self-pay | Admitting: Sleep Medicine

## 2023-11-29 VITALS — BP 110/80 | HR 56 | Temp 97.6°F | Wt 255.4 lb

## 2023-11-29 DIAGNOSIS — E669 Obesity, unspecified: Secondary | ICD-10-CM

## 2023-11-29 DIAGNOSIS — I10 Essential (primary) hypertension: Secondary | ICD-10-CM

## 2023-11-29 DIAGNOSIS — G4733 Obstructive sleep apnea (adult) (pediatric): Secondary | ICD-10-CM | POA: Diagnosis not present

## 2023-11-29 DIAGNOSIS — Z6834 Body mass index (BMI) 34.0-34.9, adult: Secondary | ICD-10-CM | POA: Diagnosis not present

## 2023-11-29 MED ORDER — TIRZEPATIDE-WEIGHT MANAGEMENT 2.5 MG/0.5ML ~~LOC~~ SOLN
2.5000 mg | SUBCUTANEOUS | 0 refills | Status: DC
Start: 2023-11-29 — End: 2023-12-27

## 2023-11-29 NOTE — Telephone Encounter (Signed)
 I am glad to prescribe the medication but it will all depend on his insurance's approval. Rx sent. If it is approved then he should arrange a follow-up with me about a month after he has been taking it.

## 2023-11-29 NOTE — Telephone Encounter (Signed)
 Copied from CRM [id number]. Topic: General - Other >> Nov 29, 2023 12:55 PM Matthew Hess wrote: Reason for CRM: Patient called in stating that when he went to his appointment today for his sleep study ,he received information about a medication for sleep apnea and weight loss, medication called zepbound tirzepatide. He would like to know if Dr Johnette Naegeli would look into prescribing this medication to him?

## 2023-11-29 NOTE — Telephone Encounter (Signed)
 Pt was advised of medication PA process and next steps.

## 2023-11-29 NOTE — Progress Notes (Signed)
 Name:Matthew Hess MRN: [id number] DOB: [birth year]   CHIEF COMPLAINT:  REASSESSMENT OF OSA   HISTORY OF PRESENT ILLNESS:  Matthew Hess is a 68 y.o. w/ a h/o OSA, HTN and obesity who presents to establish care for OSA. Reports that he was initially diagnosed with OSA around 30 years ago, states that he did not proceed with CPAP therapy at that time due to not having insurance. Reports a 60 lb weight gain over the last several years. States that he purchased a CPAP device on the internet around 5 years ago and started using it. Reports that his CPAP device malfunctioned recently and his sleep has been significantly disrupted. Reports c/o loud snoring, witnessed apnea and excessive daytime sleepiness. Reports nocturnal awakenings due to nocturia, however does not have difficulty falling back to sleep. Denies morning headaches, RLS symptoms, dream enactment, cataplexy, hypnagogic or hypnapompic hallucinations. Denies a family history of sleep apnea. Reports drowsy driving. Drinks 3-4 cups of coffee daily, drinks alcohol nightly, denies tobacco or illicit drug use.   Bedtime 10-11 pm Sleep onset 10 mins Rise time 5:20-6:30 am   EPWORTH SLEEP SCORE    11/29/2023   10:00 AM  Results of the Epworth flowsheet  Sitting and reading 3  Watching TV 1  Sitting, inactive in a public place (e.g. a theatre or a meeting) 0  As a passenger in a car for an hour without a break 0  Lying down to rest in the afternoon when circumstances permit 2  Sitting and talking to someone 0  Sitting quietly after a lunch without alcohol 1  In a car, while stopped for a few minutes in traffic 0  Total score 7     PAST MEDICAL HISTORY :   has a past medical history of Diverticulitis (2005), Erectile dysfunction, GERD (gastroesophageal reflux disease), History of adenomatous polyp of colon, Hypercholesterolemia, Hypertension, Mild dilation of ascending aorta (HCC) (10/2021), and OSA (obstructive sleep  apnea).  has a past surgical history that includes Tonsillectomy and adenoidectomy (2000); Colonoscopy; Segmental colectomy (2008); and CORONARY CALCIUM  SCORE (10/2021). Prior to Admission medications   Medication Sig Start Date End Date Taking? Authorizing Provider  atorvastatin  (LIPITOR) 20 MG tablet TAKE 1 TABLET BY MOUTH EVERY DAY 10/21/23  Yes McGowen, Minetta Aly, MD  irbesartan  (AVAPRO ) 150 MG tablet Take 1 tablet (150 mg total) by mouth daily. 06/01/23  Yes McGowen, Minetta Aly, MD   No Known Allergies  FAMILY HISTORY:  family history includes Alcohol abuse in his daughter, maternal grandmother, and paternal grandfather; Arthritis in his mother and sister; Cancer in his father; Depression in his daughter and father; Diabetes in his mother and paternal grandmother; Hearing loss in his mother; Mental illness in his father and paternal grandfather. SOCIAL HISTORY:  reports that he has never smoked. He has never used smokeless tobacco. He reports current alcohol use.   Review of Systems:  Gen:  Denies  fever, sweats, chills weight loss  HEENT: Denies blurred vision, double vision, ear pain, eye pain, hearing loss, nose bleeds, sore throat Cardiac:  No dizziness, chest pain or heaviness, chest tightness,edema, No JVD Resp:   No cough, -sputum production, -shortness of breath,-wheezing, -hemoptysis,  Gi: Denies swallowing difficulty, stomach pain, nausea or vomiting, diarrhea, constipation, bowel incontinence Gu:  Denies bladder incontinence, burning urine Ext:   Denies Joint pain, stiffness or swelling Skin: Denies  skin rash, easy bruising or bleeding or hives Endoc:  Denies polyuria, polydipsia , polyphagia or  weight change Psych:   Denies depression, insomnia or hallucinations  Other:  All other systems negative  VITAL SIGNS: BP 110/80 (BP Location: Right Arm, Patient Position: Sitting, Cuff Size: Large)   Pulse (!) 56   Temp 97.6 F (36.4 C) (Oral)   Wt 255 lb 6.4 oz (115.8 kg)    SpO2 96%   BMI 34.64 kg/m    Physical Examination:   General Appearance: No distress  EYES PERRLA, EOM intact.   NECK Supple, No JVD Pulmonary: normal breath sounds, No wheezing.  CardiovascularNormal S1,S2.  No m/r/g.   Abdomen: Benign, Soft, non-tender. Skin:   warm, no rashes, no ecchymosis  Extremities: normal, no cyanosis, clubbing. Neuro:without focal findings,  speech normal  PSYCHIATRIC: Mood, affect within normal limits.   ASSESSMENT AND PLAN  OSA Due to significant weight changes, will reassess apnea with HST.  Discussed the consequences of untreated sleep apnea. Advised not to drive drowsy for safety of patient and others. Will complete further evaluation with a home sleep study and send order for APAP RX after reviewing results.    HTN Stable, on current management. Following with PCP.   Obesity Counseled patient on diet and lifestyle modification.    MEDICATION ADJUSTMENTS/LABS AND TESTS ORDERED: Recommend Sleep Study   Patient  satisfied with Plan of action and management. All questions answered  Follow up to review HST results and treatment plan.   I spent a total of 60 minutes reviewing chart data, face-to-face evaluation with the patient, counseling and coordination of care as detailed above.    Kirra Verga, M.D.  Sleep Medicine [facility]

## 2023-11-29 NOTE — Patient Instructions (Signed)
 Matthew Hess

## 2023-12-10 DIAGNOSIS — G4730 Sleep apnea, unspecified: Secondary | ICD-10-CM | POA: Diagnosis not present

## 2023-12-10 DIAGNOSIS — G4733 Obstructive sleep apnea (adult) (pediatric): Secondary | ICD-10-CM

## 2023-12-20 DIAGNOSIS — G4733 Obstructive sleep apnea (adult) (pediatric): Secondary | ICD-10-CM | POA: Diagnosis not present

## 2023-12-21 ENCOUNTER — Ambulatory Visit: Payer: Self-pay

## 2023-12-27 ENCOUNTER — Encounter: Payer: Self-pay | Admitting: Sleep Medicine

## 2023-12-27 ENCOUNTER — Ambulatory Visit: Payer: Medicare (Managed Care) | Admitting: Sleep Medicine

## 2023-12-27 VITALS — BP 120/80 | HR 65 | Temp 99.0°F | Ht 67.0 in | Wt 254.4 lb

## 2023-12-27 DIAGNOSIS — I10 Essential (primary) hypertension: Secondary | ICD-10-CM

## 2023-12-27 DIAGNOSIS — Z6839 Body mass index (BMI) 39.0-39.9, adult: Secondary | ICD-10-CM | POA: Diagnosis not present

## 2023-12-27 DIAGNOSIS — G4733 Obstructive sleep apnea (adult) (pediatric): Secondary | ICD-10-CM | POA: Diagnosis not present

## 2023-12-27 DIAGNOSIS — E66812 Obesity, class 2: Secondary | ICD-10-CM

## 2023-12-27 MED ORDER — TIRZEPATIDE-WEIGHT MANAGEMENT 2.5 MG/0.5ML ~~LOC~~ SOAJ: 2.5000 mg | SUBCUTANEOUS | 1 refills | Status: DC

## 2023-12-27 NOTE — Progress Notes (Signed)
 Name:Matthew Hess MRN: [id number] DOB: January 23, 1956   CHIEF COMPLAINT:  HST F/U   HISTORY OF PRESENT ILLNESS:  Matthew Hess is a 69 y.o. w/ a h/o OSA, HTN and obesity who presents to follow up on HST results. The patient underwent HST which revealed severe sleep apnea (AHI 59, O2 nadir 74%).     EPWORTH SLEEP SCORE    11/29/2023   10:00 AM  Results of the Epworth flowsheet  Sitting and reading 3  Watching TV 1  Sitting, inactive in a public place (e.g. a theatre or a meeting) 0  As a passenger in a car for an hour without a break 0  Lying down to rest in the afternoon when circumstances permit 2  Sitting and talking to someone 0  Sitting quietly after a lunch without alcohol 1  In a car, while stopped for a few minutes in traffic 0  Total score 7      PAST MEDICAL HISTORY :   has a past medical history of Diverticulitis (2005), Erectile dysfunction, GERD (gastroesophageal reflux disease), History of adenomatous polyp of colon, Hypercholesterolemia, Hypertension, Mild dilation of ascending aorta (HCC) (10/2021), and OSA (obstructive sleep apnea).  has a past surgical history that includes Tonsillectomy and adenoidectomy (2000); Colonoscopy; Segmental colectomy (2008); and CORONARY CALCIUM  SCORE (10/2021). Prior to Admission medications   Medication Sig Start Date End Date Taking? Authorizing Provider  atorvastatin  (LIPITOR) 20 MG tablet TAKE 1 TABLET BY MOUTH EVERY DAY 10/21/23  Yes McGowen, Aleene DEL, MD  irbesartan  (AVAPRO ) 150 MG tablet Take 1 tablet (150 mg total) by mouth daily. 06/01/23  Yes McGowen, Aleene DEL, MD  tirzepatide  (ZEPBOUND ) 2.5 MG/0.5ML injection vial Inject 2.5 mg into the skin once a week. 11/29/23  Yes McGowen, Aleene DEL, MD   No Known Allergies  FAMILY HISTORY:  family history includes Alcohol abuse in his daughter, maternal grandmother, and paternal grandfather; Arthritis in his mother and sister; Cancer in his father; Depression in his daughter  and father; Diabetes in his mother and paternal grandmother; Hearing loss in his mother; Mental illness in his father and paternal grandfather. SOCIAL HISTORY:  reports that he has never smoked. He has never used smokeless tobacco. He reports current alcohol use.   Review of Systems:  Gen:  Denies  fever, sweats, chills weight loss  HEENT: Denies blurred vision, double vision, ear pain, eye pain, hearing loss, nose bleeds, sore throat Cardiac:  No dizziness, chest pain or heaviness, chest tightness,edema, No JVD Resp:   No cough, -sputum production, -shortness of breath,-wheezing, -hemoptysis,  Gi: Denies swallowing difficulty, stomach pain, nausea or vomiting, diarrhea, constipation, bowel incontinence Gu:  Denies bladder incontinence, burning urine Ext:   Denies Joint pain, stiffness or swelling Skin: Denies  skin rash, easy bruising or bleeding or hives Endoc:  Denies polyuria, polydipsia , polyphagia or weight change Psych:   Denies depression, insomnia or hallucinations  Other:  All other systems negative  VITAL SIGNS: BP 120/80 (BP Location: Right Arm, Patient Position: Sitting, Cuff Size: Large)   Pulse 65   Temp 99 F (37.2 C) (Oral)   Ht 5' 7 (1.702 m)   Wt 254 lb 6.4 oz (115.4 kg)   SpO2 97%   BMI 39.84 kg/m    Physical Examination:   General Appearance: No distress  EYES PERRLA, EOM intact.   NECK Supple, No JVD Pulmonary: normal breath sounds, No wheezing.  CardiovascularNormal S1,S2.  No m/r/g.   Abdomen: Benign,  Soft, non-tender. Skin:   warm, no rashes, no ecchymosis  Extremities: normal, no cyanosis, clubbing. Neuro:without focal findings,  speech normal  PSYCHIATRIC: Mood, affect within normal limits.   ASSESSMENT AND PLAN  OSA Reviewed HST results with patient. Starting on APAP therapy set to 4-20 cm H2O. Discussed the consequences of untreated sleep apnea. Advised not to drive drowsy for safety of patient and others. Will follow up in 3 months.      HTN Stable, on current management. Following with PCP.   Obesity Counseled patient on diet and lifestyle modification. Will also try patient on Zepbound  and titrate as tolerated. Patient denies a personal or family history of thyroid medullary CA or pancreatitis.    Patient  satisfied with Plan of action and management. All questions answered  I spent a total of 32 minutes reviewing chart data, face-to-face evaluation with the patient, counseling and coordination of care as detailed above.    York Valliant, M.D.  Sleep Medicine [facility]

## 2023-12-27 NOTE — Patient Instructions (Signed)

## 2023-12-29 ENCOUNTER — Telehealth: Payer: Self-pay

## 2023-12-29 NOTE — Telephone Encounter (Signed)
 Copied from CRM [id number]. Topic: Clinical - Order For Equipment >> Dec 29, 2023  3:35 PM Isabell A wrote: Reason for CRM: Patient states he's not in network for his CPAP machine. Patient states he is in [place] [employer] [place] in network but they're requiring information from the provider.  [employer] phone number: [phone]    Callback number: [phone]

## 2024-01-04 NOTE — Telephone Encounter (Signed)
 Spoke with clinic access line and they spoke with ms anita and she is going to give them a call right now . And wait for them to give patient a call . Relaying information to patient.

## 2024-01-04 NOTE — Telephone Encounter (Signed)
 Patient calling because he has been over there about a cpap and the doctor referred me to a company andf they was not in network and called another company and they said they was in net work im dying . they are saying the doctor have to send some information . Calling cal to see what's going on .

## 2024-01-05 NOTE — Telephone Encounter (Signed)
 I called the number given by the patient and that was a [employer] in [place], [place]. She stated for me to send the referral to our local [employer] office which I did on 01/02/24. I spoke with [place] with local [employer] she stated they had the order and the sleep study but no insurance or notes. I refaxed the insurance and notes to [employer] on 01/04/24. I also called Mr. Dy and let him know I had sent his Cpap order to local [employer]

## 2024-01-11 ENCOUNTER — Ambulatory Visit (INDEPENDENT_AMBULATORY_CARE_PROVIDER_SITE_OTHER): Payer: Medicare (Managed Care) | Admitting: *Deleted

## 2024-01-11 ENCOUNTER — Other Ambulatory Visit: Payer: Self-pay | Admitting: Family Medicine

## 2024-01-11 VITALS — Ht 72.0 in | Wt 254.0 lb

## 2024-01-11 DIAGNOSIS — Z0000 Encounter for general adult medical examination without abnormal findings: Secondary | ICD-10-CM | POA: Diagnosis not present

## 2024-01-11 NOTE — Patient Instructions (Signed)
 Matthew Hess , Thank you for taking time to come for your Medicare Wellness Visit. I appreciate your ongoing commitment to your health goals. Please review the following plan we discussed and let me know if I can assist you in the future.   Screening recommendations/referrals: Colonoscopy: up to date Recommended yearly ophthalmology/optometry visit for glaucoma screening and checkup Recommended yearly dental visit for hygiene and checkup  Vaccinations: Influenza vaccine:  Pneumococcal vaccine:  Tdap vaccine:  Shingles vaccine:      Preventive Care 65 Years and Older, Male Preventive care refers to lifestyle choices and visits with your health care provider that can promote health and wellness. What does preventive care include? A yearly physical exam. This is also called an annual well check. Dental exams once or twice a year. Routine eye exams. Ask your health care provider how often you should have your eyes checked. Personal lifestyle choices, including: Daily care of your teeth and gums. Regular physical activity. Eating a healthy diet. Avoiding tobacco and drug use. Limiting alcohol use. Practicing safe sex. Taking low doses of aspirin every day. Taking vitamin and mineral supplements as recommended by your health care provider. What happens during an annual well check? The services and screenings done by your health care provider during your annual well check will depend on your age, overall health, lifestyle risk factors, and family history of disease. Counseling  Your health care provider may ask you questions about your: Alcohol use. Tobacco use. Drug use. Emotional well-being. Home and relationship well-being. Sexual activity. Eating habits. History of falls. Memory and ability to understand (cognition). Work and work Astronomer. Screening  You may have the following tests or measurements: Height, weight, and BMI. Blood pressure. Lipid and cholesterol levels.  These may be checked every 5 years, or more frequently if you are over 74 years old. Skin check. Lung cancer screening. You may have this screening every year starting at age 32 if you have a 30-pack-year history of smoking and currently smoke or have quit within the past 15 years. Fecal occult blood test (FOBT) of the stool. You may have this test every year starting at age 4. Flexible sigmoidoscopy or colonoscopy. You may have a sigmoidoscopy every 5 years or a colonoscopy every 10 years starting at age 57. Prostate cancer screening. Recommendations will vary depending on your family history and other risks. Hepatitis C blood test. Hepatitis B blood test. Sexually transmitted disease (STD) testing. Diabetes screening. This is done by checking your blood sugar (glucose) after you have not eaten for a while (fasting). You may have this done every 1-3 years. Abdominal aortic aneurysm (AAA) screening. You may need this if you are a current or former smoker. Osteoporosis. You may be screened starting at age 60 if you are at high risk. Talk with your health care provider about your test results, treatment options, and if necessary, the need for more tests. Vaccines  Your health care provider may recommend certain vaccines, such as: Influenza vaccine. This is recommended every year. Tetanus, diphtheria, and acellular pertussis (Tdap, Td) vaccine. You may need a Td booster every 10 years. Zoster vaccine. You may need this after age 38. Pneumococcal 13-valent conjugate (PCV13) vaccine. One dose is recommended after age 51. Pneumococcal polysaccharide (PPSV23) vaccine. One dose is recommended after age 41. Talk to your health care provider about which screenings and vaccines you need and how often you need them. This information is not intended to replace advice given to you by your health  care provider. Make sure you discuss any questions you have with your health care provider. Document Released:  07/04/2015 Document Revised: 02/25/2016 Document Reviewed: 04/08/2015 Elsevier Interactive Patient Education  2017 [employer].  Fall Prevention in the Home Falls can cause injuries. They can happen to people of all ages. There are many things you can do to make your home safe and to help prevent falls. What can I do on the outside of my home? Regularly fix the edges of walkways and driveways and fix any cracks. Remove anything that might make you trip as you walk through a door, such as a raised step or threshold. Trim any bushes or trees on the path to your home. Use bright outdoor lighting. Clear any walking paths of anything that might make someone trip, such as rocks or tools. Regularly check to see if handrails are loose or broken. Make sure that both sides of any steps have handrails. Any raised decks and porches should have guardrails on the edges. Have any leaves, snow, or ice cleared regularly. Use sand or salt on walking paths during winter. Clean up any spills in your garage right away. This includes oil or grease spills. What can I do in the bathroom? Use night lights. Install grab bars by the toilet and in the tub and shower. Do not use towel bars as grab bars. Use non-skid mats or decals in the tub or shower. If you need to sit down in the shower, use a plastic, non-slip stool. Keep the floor dry. Clean up any water that spills on the floor as soon as it happens. Remove soap buildup in the tub or shower regularly. Attach bath mats securely with double-sided non-slip rug tape. Do not have throw rugs and other things on the floor that can make you trip. What can I do in the bedroom? Use night lights. Make sure that you have a light by your bed that is easy to reach. Do not use any sheets or blankets that are too big for your bed. They should not hang down onto the floor. Have a firm chair that has side arms. You can use this for support while you get dressed. Do not have  throw rugs and other things on the floor that can make you trip. What can I do in the kitchen? Clean up any spills right away. Avoid walking on wet floors. Keep items that you use a lot in easy-to-reach places. If you need to reach something above you, use a strong step stool that has a grab bar. Keep electrical cords out of the way. Do not use floor polish or wax that makes floors slippery. If you must use wax, use non-skid floor wax. Do not have throw rugs and other things on the floor that can make you trip. What can I do with my stairs? Do not leave any items on the stairs. Make sure that there are handrails on both sides of the stairs and use them. Fix handrails that are broken or loose. Make sure that handrails are as long as the stairways. Check any carpeting to make sure that it is firmly attached to the stairs. Fix any carpet that is loose or worn. Avoid having throw rugs at the top or bottom of the stairs. If you do have throw rugs, attach them to the floor with carpet tape. Make sure that you have a light switch at the top of the stairs and the bottom of the stairs. If you do not  have them, ask someone to add them for you. What else can I do to help prevent falls? Wear shoes that: Do not have high heels. Have rubber bottoms. Are comfortable and fit you well. Are closed at the toe. Do not wear sandals. If you use a stepladder: Make sure that it is fully opened. Do not climb a closed stepladder. Make sure that both sides of the stepladder are locked into place. Ask someone to hold it for you, if possible. Clearly mark and make sure that you can see: Any grab bars or handrails. First and last steps. Where the edge of each step is. Use tools that help you move around (mobility aids) if they are needed. These include: Canes. Walkers. Scooters. Crutches. Turn on the lights when you go into a dark area. Replace any light bulbs as soon as they burn out. Set up your furniture so  you have a clear path. Avoid moving your furniture around. If any of your floors are uneven, fix them. If there are any pets around you, be aware of where they are. Review your medicines with your doctor. Some medicines can make you feel dizzy. This can increase your chance of falling. Ask your doctor what other things that you can do to help prevent falls. This information is not intended to replace advice given to you by your health care provider. Make sure you discuss any questions you have with your health care provider. Document Released: 04/03/2009 Document Revised: 11/13/2015 Document Reviewed: 07/12/2014 Elsevier Interactive Patient Education  2017 [employer].

## 2024-01-11 NOTE — Progress Notes (Signed)
 Subjective:   Matthew Hess is a 68 y.o. male who presents for Medicare Annual/Subsequent preventive examination.  Visit Complete: Virtual I connected with  Kwasi W Brand on 01/11/24 by a audio enabled telemedicine application and verified that I am speaking with the correct person using two identifiers.  Patient Location: Home  Provider Location: Home Office  I discussed the limitations of evaluation and management by telemedicine. The patient expressed understanding and agreed to proceed.  Vital Signs: Because this visit was a virtual/telehealth visit, some criteria may be missing or patient reported. Any vitals not documented were not able to be obtained and vitals that have been documented are patient reported.    Cardiac Risk Factors include: advanced age (>30men, >20 women);obesity (BMI >30kg/m2);male gender;hypertension     Objective:    Today's Vitals   01/11/24 1407  Weight: 254 lb (115.2 kg)  Height: 6' (1.829 m)   Body mass index is 34.45 kg/m.     01/11/2024    2:04 PM 10/14/2021    2:27 PM 01/17/2017    2:28 PM  Advanced Directives  Does Patient Have a Medical Advance Directive? No Yes No   Type of Advance Directive  Living will   Would patient like information on creating a medical advance directive? No - Patient declined       Data saved with a previous flowsheet row definition    Current Medications (verified) Outpatient Encounter Medications as of 01/11/2024  Medication Sig   atorvastatin  (LIPITOR) 20 MG tablet TAKE 1 TABLET BY MOUTH EVERY DAY   irbesartan  (AVAPRO ) 150 MG tablet TAKE 1 TABLET BY MOUTH EVERY DAY   tirzepatide  (ZEPBOUND ) 2.5 MG/0.5ML Pen Inject 2.5 mg into the skin once a week. (Patient not taking: Reported on 01/11/2024)   No facility-administered encounter medications on file as of 01/11/2024.    Allergies (verified) Patient has no known allergies.   History: Past Medical History:  Diagnosis Date   Diverticulitis 2005    recurrent->segmental colectomy   Erectile dysfunction    GERD (gastroesophageal reflux disease)    History of adenomatous polyp of colon    03/2021. 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  Rpt 2022-[facility]->polypectomy, recall 03/2024.   CORONARY CALCIUM  SCORE  10/2021   2023-->21st %'tile   Segmental colectomy  2008   d/t recurrent diverticulitis   TONSILLECTOMY AND ADENOIDECTOMY  2000   and uvulectomy   Family History  Problem Relation Age of Onset   Arthritis Mother    Hearing loss Mother    Diabetes Mother    Cancer Father    Depression Father    Mental illness Father    Arthritis Sister    Alcohol abuse Maternal Grandmother    Diabetes Paternal Grandmother    Alcohol abuse Paternal Grandfather    Mental illness Paternal Grandfather    Alcohol abuse Daughter    Depression Daughter    Social History   Socioeconomic History   Marital status: Married    Spouse name: Not on file   Number of children: Not on file   Years of education: Not on file   Highest education level: Not on file  Occupational History  Not on file  Tobacco Use   Smoking status: Never   Smokeless tobacco: Never  Substance and Sexual Activity   Alcohol use: Yes    Comment: occasionly    Drug use: Not on file   Sexual activity: Not Currently  Other Topics Concern   Not on file  Social History Narrative   Married, 2 children.   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   No tob.   Alcohol-->hx of liquor but quit this, ongoing beer about a case per week as of 06/2021.   Social Drivers of Corporate investment banker Strain: Low Risk  (01/11/2024)   Overall Financial Resource Strain (CARDIA)    Difficulty of Paying Living Expenses:  Not hard at all  Food Insecurity: No Food Insecurity (01/11/2024)   Hunger Vital Sign    Worried About Running Out of Food in the Last Year: Never true    Ran Out of Food in the Last Year: Never true  Transportation Needs: Unknown (01/11/2024)   PRAPARE - Administrator, Civil Service (Medical): No    Lack of Transportation (Non-Medical): Not on file  Physical Activity: Inactive (01/11/2024)   Exercise Vital Sign    Days of Exercise per Week: 0 days    Minutes of Exercise per Session: 0 min  Stress: No Stress Concern Present (01/11/2024)   [employer] of Occupational Health - Occupational Stress Questionnaire    Feeling of Stress: Not at all  Social Connections: Socially Integrated (01/11/2024)   Social Connection and Isolation Panel    Frequency of Communication with Friends and Family: More than three times a week    Frequency of Social Gatherings with Friends and Family: More than three times a week    Attends Religious Services: More than 4 times per year    Active Member of [employer] or Organizations: Yes    Attends Engineer, structural: More than 4 times per year    Marital Status: Married    Tobacco Counseling Counseling given: Not Answered   Clinical Intake:  Pre-visit preparation completed: Yes  Pain : No/denies pain     Diabetes: No  How often do you need to have someone help you when you read instructions, pamphlets, or other written materials from your doctor or pharmacy?: 1 - Never  Interpreter Needed?: No  Information entered by :: Mliss Graff LPN   Activities of Daily Living    01/11/2024    2:16 PM  In your present state of health, do you have any difficulty performing the following activities:  Hearing? 0  Vision? 0  Difficulty concentrating or making decisions? 0  Walking or climbing stairs? 0  Dressing or bathing? 0  Doing errands, shopping? 0  Preparing Food and eating ? N  Using the Toilet? N  In the past six months,  have you accidently leaked urine? N  Do you have problems with loss of bowel control? N  Managing your Medications? N  Managing your Finances? N  Housekeeping or managing your Housekeeping? N    Patient Care Team: Candise Aleene DEL, MD as PCP - General (Family Medicine) Renetta, Glendia CROME, MD as Consulting Physician (Gastroenterology)  Indicate any recent Medical Services you may have received from other than [facility] providers in the past year (date may be approximate).     Assessment:   This is a routine wellness examination for Dory.  Hearing/Vision screen Hearing Screening - Comments:: No trouble hearing Vision Screening - Comments:: [facility] Up  to date   Goals Addressed             This Visit's Progress    Patient Stated   On track    None at this time      Patient Stated   On track    Stay healthy      Weight (lb) < 200 lb (90.7 kg)   254 lb (115.2 kg)      Depression Screen    01/11/2024    2:08 PM 11/07/2023    8:30 AM 06/01/2023    8:04 AM 10/20/2022    2:06 PM 05/24/2022    8:38 AM 10/14/2021    2:26 PM 06/29/2021    1:57 PM  PHQ 2/9 Scores  PHQ - 2 Score 0 0 0 0 0 0 0  PHQ- 9 Score 3          Fall Risk    01/11/2024    2:04 PM 11/07/2023    8:29 AM 06/01/2023    8:04 AM 10/20/2022    2:08 PM 05/24/2022    8:39 AM  Fall Risk   Falls in the past year? 0 0 0 0 0  Number falls in past yr: 0 0  0 0  Injury with Fall? 0 0  0 0  Risk for fall due to :  No Fall Risks  Impaired vision Impaired vision  Follow up Falls evaluation completed;Education provided;Falls prevention discussed Falls evaluation completed Falls evaluation completed Falls prevention discussed Falls evaluation completed      Data saved with a previous flowsheet row definition    MEDICARE RISK AT HOME: Medicare Risk at Home Any stairs in or around the home?: Yes If so, are there any without handrails?: No Home free of loose throw rugs in walkways, pet beds, electrical cords, etc?:  Yes Adequate lighting in your home to reduce risk of falls?: Yes Life alert?: No Use of a cane, walker or w/c?: No Grab bars in the bathroom?: No Shower chair or bench in shower?: Yes Elevated toilet seat or a handicapped toilet?: Yes  TIMED UP AND GO:  Was the test performed?  No    Cognitive Function:        01/11/2024    2:06 PM 10/20/2022    2:08 PM 10/14/2021    2:30 PM  6CIT Screen  What Year? 0 points 0 points 0 points  What month? 0 points 0 points 0 points  What time? 0 points 0 points   Count back from 20 0 points 0 points   Months in reverse 0 points 0 points   Repeat phrase 0 points 0 points   Total Score 0 points 0 points     Immunizations Immunization History  Administered Date(s) Administered   Influenza-Unspecified 06/21/2008   PFIZER(Purple Top)SARS-COV-2 Vaccination 01/04/2020, 01/26/2020   PNEUMOCOCCAL CONJUGATE-20 06/29/2021   Tdap 06/21/2008, 09/30/2021   Zoster Recombinant(Shingrix) 09/30/2021    TDAP status: Up to date  Flu Vaccine status: Due, Education has been provided regarding the importance of this vaccine. Advised may receive this vaccine at local pharmacy or Health Dept. Aware to provide a copy of the vaccination record if obtained from local pharmacy or Health Dept. Verbalized acceptance and understanding.  Pneumococcal vaccine status: Up to date  Covid-19 vaccine status: Declined, Education has been provided regarding the importance of this vaccine but patient still declined. Advised may receive this vaccine at local pharmacy or Health Dept.or vaccine clinic. Aware to provide a copy of  the vaccination record if obtained from local pharmacy or Health Dept. Verbalized acceptance and understanding.  Qualifies for Shingles Vaccine? Yes   Zostavax completed No   Shingrix Completed?: No.    Education has been provided regarding the importance of this vaccine. Patient has been advised to call insurance company to determine out of pocket expense  if they have not yet received this vaccine. Advised may also receive vaccine at local pharmacy or Health Dept. Verbalized acceptance and understanding.  Screening Tests Health Maintenance  Topic Date Due   Hepatitis C Screening  Never done   Zoster Vaccines- Shingrix (2 of 2) 11/25/2021   INFLUENZA VACCINE  01/20/2024   Medicare Annual Wellness (AWV)  01/10/2025   Colonoscopy  04/13/2026   DTaP/Tdap/Td (3 - Td or Tdap) 10/01/2031   Pneumococcal Vaccine: 50+ Years  Completed   Hepatitis B Vaccines  Aged Out   HPV VACCINES  Aged Out   Meningococcal B Vaccine  Aged Out   COVID-19 Vaccine  Discontinued    Health Maintenance  Health Maintenance Due  Topic Date Due   Hepatitis C Screening  Never done   Zoster Vaccines- Shingrix (2 of 2) 11/25/2021    Colorectal cancer screening: Type of screening: Colonoscopy. Completed 2022. Repeat every 5 years  Lung Cancer Screening: (Low Dose CT Chest recommended if Age 82-80 years, 20 pack-year currently smoking OR have quit w/in 15years.) does not qualify.   Lung Cancer Screening Referral:   Additional Screening:  Hepatitis C Screening: never done  Vision Screening: Recommended annual ophthalmology exams for early detection of glaucoma and other disorders of the eye. Is the patient up to date with their annual eye exam?  Yes  Who is the provider or what is the name of the office in which the patient attends annual eye exams? [facility] If pt is not established with a provider, would they like to be referred to a provider to establish care? No .   Dental Screening: Recommended annual dental exams for proper oral hygiene    Community Resource Referral / Chronic Care Management: CRR required this visit?  No   CCM required this visit?  No     Plan:     I have personally reviewed and noted the following in the patient's chart:   Medical and social history Use of alcohol, tobacco or illicit drugs  Current medications and supplements  including opioid prescriptions. Patient is not currently taking opioid prescriptions. Functional ability and status Nutritional status Physical activity Advanced directives List of other physicians Hospitalizations, surgeries, and ER visits in previous 12 months Vitals Screenings to include cognitive, depression, and falls Referrals and appointments  In addition, I have reviewed and discussed with patient certain preventive protocols, quality metrics, and best practice recommendations. A written personalized care plan for preventive services as well as general preventive health recommendations were provided to patient.     Mliss Graff, LPN   [id number]   After Visit Summary: (MyChart) Due to this being a telephonic visit, the after visit summary with patients personalized plan was offered to patient via MyChart   Nurse Notes:

## 2024-01-25 DIAGNOSIS — G4733 Obstructive sleep apnea (adult) (pediatric): Secondary | ICD-10-CM | POA: Diagnosis not present

## 2024-01-26 NOTE — Telephone Encounter (Signed)
 We are going to assume [employer] has this order since we have received an approval from [employer] for his Cpap

## 2024-02-25 DIAGNOSIS — G4733 Obstructive sleep apnea (adult) (pediatric): Secondary | ICD-10-CM | POA: Diagnosis not present

## 2024-03-07 DIAGNOSIS — G4733 Obstructive sleep apnea (adult) (pediatric): Secondary | ICD-10-CM | POA: Diagnosis not present

## 2024-03-26 DIAGNOSIS — G4733 Obstructive sleep apnea (adult) (pediatric): Secondary | ICD-10-CM | POA: Diagnosis not present

## 2024-03-29 ENCOUNTER — Ambulatory Visit: Payer: Medicare (Managed Care) | Admitting: Sleep Medicine

## 2024-03-29 ENCOUNTER — Encounter: Payer: Self-pay | Admitting: Sleep Medicine

## 2024-03-29 VITALS — BP 132/84 | HR 55 | Temp 97.4°F | Ht 72.0 in | Wt 262.8 lb

## 2024-03-29 DIAGNOSIS — Z6835 Body mass index (BMI) 35.0-35.9, adult: Secondary | ICD-10-CM

## 2024-03-29 DIAGNOSIS — E669 Obesity, unspecified: Secondary | ICD-10-CM

## 2024-03-29 DIAGNOSIS — G4733 Obstructive sleep apnea (adult) (pediatric): Secondary | ICD-10-CM

## 2024-03-29 DIAGNOSIS — I10 Essential (primary) hypertension: Secondary | ICD-10-CM | POA: Diagnosis not present

## 2024-03-29 MED ORDER — ZEPBOUND 2.5 MG/0.5ML ~~LOC~~ SOAJ: 2.5000 mg | SUBCUTANEOUS | 1 refills | Status: DC

## 2024-03-29 NOTE — Progress Notes (Signed)
 Name:Matthew Hess MRN: [id number] DOB: 10-10-1955   CHIEF COMPLAINT:  CPAP F/U   HISTORY OF PRESENT ILLNESS:  Matthew Hess is a 68 y.o. w/ a h/o OSA, HTN and obesity who presents for CPAP follow up visit. Reports using CPAP therapy every night, which is confirmed by compliance data. He is currently using the Airfit F30i FFM, which is comfortable. Reports air leaks. States that he feels significantly more refreshed upon awakening with CPAP therapy. Zepbound  was prescribed during last visit but patient states that he has not received it yet.    EPWORTH SLEEP SCORE    11/29/2023   10:00 AM  Results of the Epworth flowsheet  Sitting and reading 3  Watching TV 1  Sitting, inactive in a public place (e.g. 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  has a past surgical history that includes Tonsillectomy and adenoidectomy (2000); Colonoscopy; Segmental colectomy (2008); and CORONARY CALCIUM  SCORE (10/2021). Prior to Admission medications   Medication Sig Start Date End Date Taking? Authorizing Provider  atorvastatin  (LIPITOR) 20 MG tablet TAKE 1 TABLET BY MOUTH EVERY DAY 10/21/23  Yes McGowen, Aleene DEL, MD  irbesartan  (AVAPRO ) 150 MG tablet Take 1 tablet (150 mg total) by mouth daily. 06/01/23  Yes McGowen, Aleene DEL, MD  tirzepatide  (ZEPBOUND ) 2.5 MG/0.5ML injection vial Inject 2.5 mg into the skin once a week. 11/29/23  Yes McGowen,  Aleene DEL, MD   No Known Allergies  FAMILY HISTORY:  family history includes Alcohol abuse in his daughter, maternal grandmother, and paternal grandfather; Arthritis in his mother and sister; Cancer in his father; Depression in his daughter and father; Diabetes in his mother and paternal grandmother; Hearing loss in his mother; Mental illness in his father and paternal grandfather. SOCIAL HISTORY:  reports that he has never smoked. He has never used smokeless tobacco. He reports current alcohol use.   Review of Systems:  Gen:  Denies  fever, sweats, chills weight loss  HEENT: Denies blurred vision, double vision, ear pain, eye pain, hearing loss, nose bleeds, sore throat Cardiac:  No dizziness, chest pain or heaviness, chest tightness,edema, No JVD Resp:   No cough, -sputum production, -shortness of breath,-wheezing, -hemoptysis,  Gi: Denies swallowing difficulty, stomach pain, nausea or vomiting, diarrhea, constipation, bowel incontinence Gu:  Denies bladder incontinence, burning urine Ext:   Denies Joint pain, stiffness or swelling Skin: Denies  skin rash, easy bruising or bleeding or hives Endoc:  Denies polyuria, polydipsia , polyphagia or weight change Psych:   Denies depression, insomnia or hallucinations  Other:  All other systems negative  VITAL SIGNS: BP 132/84   Pulse (!) 55   Temp (!) 97.4 F (36.3 C)   Ht 6' (1.829 m)   Wt 262 lb 12.8 oz (119.2 kg)   SpO2 96%   BMI 35.64 kg/m    Physical Examination:   General Appearance: No distress  EYES  PERRLA, EOM intact.   NECK Supple, No JVD Pulmonary: normal breath sounds, No wheezing.  CardiovascularNormal S1,S2.  No m/r/g.   Abdomen: Benign, Soft, non-tender. Skin:   warm, no rashes, no ecchymosis  Extremities: normal, no cyanosis, clubbing. Neuro:without focal findings,  speech normal  PSYCHIATRIC: Mood, affect within normal limits.   ASSESSMENT AND PLAN  OSA Patient is using and benefiting from CPAP therapy.  I suspect that AHI is elevated due to air leaks. Counseled patient on proper mask fit. Discussed the consequences of untreated sleep apnea. Advised not to drive drowsy for safety of patient and others. Will follow up in 3 months.     HTN Stable, on current management. Following with PCP.   Obesity Counseled patient on diet and lifestyle modification. Will send in Zepbound  RX again. Patient denies a personal or family history of thyroid medullary CA or pancreatitis.    Patient  satisfied with Plan of action and management. All questions answered  I spent a total of 37 minutes reviewing chart data, face-to-face evaluation with the patient, counseling and coordination of care as detailed above.    Erin Obando, M.D.  Sleep Medicine [facility]

## 2024-03-29 NOTE — Patient Instructions (Addendum)
 Patient Instructions  Continue to use CPAP every night, minimum of 4-6 hours a night.  Change equipment every 30 days or as directed by DME.  Wash your tubing with warm soap and water weekly, hang to dry. Wash humidifier portion weekly. Use distilled water and change daily   Be aware of reduced alertness and do not drive or operate heavy machinery if experiencing this or drowsiness.  Exercise encouraged, as tolerated. Encouraged proper weight management.  Important to get eight or more hours of sleep  Limiting the use of the computer and television before bedtime.  Decrease naps during the day, so night time sleep will become enhanced.  Limit caffeine, and sleep deprivation.  HTN, stroke, uncontrolled diabetes and heart failure are potential risk factors.  Risk of untreated sleep apnea including cardiac arrhthymias, stroke, DM, pulm HTN.

## 2024-04-10 DIAGNOSIS — Z1211 Encounter for screening for malignant neoplasm of colon: Secondary | ICD-10-CM | POA: Diagnosis not present

## 2024-04-10 DIAGNOSIS — G4733 Obstructive sleep apnea (adult) (pediatric): Secondary | ICD-10-CM | POA: Diagnosis not present

## 2024-04-10 DIAGNOSIS — K635 Polyp of colon: Secondary | ICD-10-CM | POA: Diagnosis not present

## 2024-04-10 DIAGNOSIS — D123 Benign neoplasm of transverse colon: Secondary | ICD-10-CM | POA: Diagnosis not present

## 2024-04-10 DIAGNOSIS — Z860101 Personal history of adenomatous and serrated colon polyps: Secondary | ICD-10-CM | POA: Diagnosis not present

## 2024-04-10 DIAGNOSIS — D122 Benign neoplasm of ascending colon: Secondary | ICD-10-CM | POA: Diagnosis not present

## 2024-05-04 ENCOUNTER — Other Ambulatory Visit: Payer: Self-pay | Admitting: Family Medicine

## 2024-06-01 ENCOUNTER — Telehealth: Payer: Self-pay

## 2024-06-01 ENCOUNTER — Encounter: Payer: Medicare (Managed Care) | Admitting: Family Medicine

## 2024-06-01 NOTE — Telephone Encounter (Signed)
 Copied from CRM #[id number]. Topic: Clinical - Medication Question >> Jun 01, 2024  9:53 AM Benton KIDD wrote: Reason for RMF:ejupzwu is calling because had appointment with dr reddy and  she told me she was going to try and help me get a prescription to help me lose weight and it will be through my cpap needing to know what that medication is  Please give patient a call with this information [phone]

## 2024-06-03 ENCOUNTER — Other Ambulatory Visit: Payer: Self-pay | Admitting: Family Medicine

## 2024-06-05 ENCOUNTER — Encounter: Payer: Self-pay | Admitting: Family Medicine

## 2024-06-05 ENCOUNTER — Ambulatory Visit: Payer: Medicare (Managed Care) | Admitting: Family Medicine

## 2024-06-05 ENCOUNTER — Ambulatory Visit: Payer: Self-pay | Admitting: Family Medicine

## 2024-06-05 VITALS — BP 130/80 | HR 62 | Temp 98.0°F | Ht 72.0 in | Wt 266.8 lb

## 2024-06-05 DIAGNOSIS — Z7689 Persons encountering health services in other specified circumstances: Secondary | ICD-10-CM

## 2024-06-05 DIAGNOSIS — E7800 Pure hypercholesterolemia, unspecified: Secondary | ICD-10-CM | POA: Diagnosis not present

## 2024-06-05 DIAGNOSIS — G4733 Obstructive sleep apnea (adult) (pediatric): Secondary | ICD-10-CM

## 2024-06-05 DIAGNOSIS — I10 Essential (primary) hypertension: Secondary | ICD-10-CM

## 2024-06-05 DIAGNOSIS — I7789 Other specified disorders of arteries and arterioles: Secondary | ICD-10-CM

## 2024-06-05 DIAGNOSIS — Z125 Encounter for screening for malignant neoplasm of prostate: Secondary | ICD-10-CM | POA: Diagnosis not present

## 2024-06-05 DIAGNOSIS — Z0000 Encounter for general adult medical examination without abnormal findings: Secondary | ICD-10-CM | POA: Diagnosis not present

## 2024-06-05 DIAGNOSIS — E66812 Obesity, class 2: Secondary | ICD-10-CM

## 2024-06-05 LAB — COMPREHENSIVE METABOLIC PANEL WITH GFR
ALT: 14 U/L (ref 3–53)
AST: 14 U/L (ref 5–37)
Albumin: 4.1 g/dL (ref 3.5–5.2)
Alkaline Phosphatase: 60 U/L (ref 39–117)
BUN: 20 mg/dL (ref 6–23)
CO2: 30 meq/L (ref 19–32)
Calcium: 8.6 mg/dL (ref 8.4–10.5)
Chloride: 103 meq/L (ref 96–112)
Creatinine, Ser: 0.8 mg/dL (ref 0.40–1.50)
GFR: 90.13 mL/min (ref >60.00)
Glucose, Bld: 122 mg/dL — ABNORMAL HIGH (ref 70–99)
Potassium: 4.1 meq/L (ref 3.5–5.1)
Sodium: 139 meq/L (ref 135–145)
Total Bilirubin: 0.6 mg/dL (ref 0.2–1.2)
Total Protein: 6.3 g/dL (ref 6.0–8.3)

## 2024-06-05 LAB — CBC WITH DIFFERENTIAL/PLATELET
Basophils Absolute: 0.1 K/uL (ref 0.0–0.1)
Basophils Relative: 2.8 % (ref 0.0–3.0)
Eosinophils Absolute: 0.2 K/uL (ref 0.0–0.7)
Eosinophils Relative: 3.1 % (ref 0.0–5.0)
HCT: 39.3 % (ref 39.0–52.0)
Hemoglobin: 13.3 g/dL (ref 13.0–17.0)
Lymphocytes Relative: 25.4 % (ref 12.0–46.0)
Lymphs Abs: 1.3 K/uL (ref 0.7–4.0)
MCHC: 33.8 g/dL (ref 30.0–36.0)
MCV: 87.9 fl (ref 78.0–100.0)
Monocytes Absolute: 0.4 K/uL (ref 0.1–1.0)
Monocytes Relative: 8.3 % (ref 3.0–12.0)
Neutro Abs: 3.1 K/uL (ref 1.4–7.7)
Neutrophils Relative %: 60.4 % (ref 43.0–77.0)
Platelets: 247 K/uL (ref 150.0–400.0)
RBC: 4.47 Mil/uL (ref 4.22–5.81)
RDW: 14.7 % (ref 11.5–15.5)
WBC: 5.1 K/uL (ref 4.0–10.5)

## 2024-06-05 LAB — PSA, MEDICARE: PSA: 1.6 ng/mL (ref 0.10–4.00)

## 2024-06-05 LAB — LIPID PANEL
Cholesterol: 144 mg/dL (ref 28–200)
HDL: 57.8 mg/dL (ref >39.00)
LDL Cholesterol: 72 mg/dL (ref 10–99)
NonHDL: 86.21
Total CHOL/HDL Ratio: 2
Triglycerides: 70 mg/dL (ref 10.0–149.0)
VLDL: 14 mg/dL (ref 0.0–40.0)

## 2024-06-05 MED ORDER — ATORVASTATIN CALCIUM 20 MG PO TABS: 20.0000 mg | ORAL_TABLET | Freq: Every day | ORAL | 3 refills | Status: AC

## 2024-06-05 NOTE — Patient Instructions (Signed)
 Health Maintenance, Male  Adopting a healthy lifestyle and getting preventive care are important in promoting health and wellness. Ask your health care provider about:  The right schedule for you to have regular tests and exams.  Things you can do on your own to prevent diseases and keep yourself healthy.  What should I know about diet, weight, and exercise?  Eat a healthy diet    Eat a diet that includes plenty of vegetables, fruits, low-fat dairy products, and lean protein.  Do not eat a lot of foods that are high in solid fats, added sugars, or sodium.  Maintain a healthy weight  Body mass index (BMI) is a measurement that can be used to identify possible weight problems. It estimates body fat based on height and weight. Your health care provider can help determine your BMI and help you achieve or maintain a healthy weight.  Get regular exercise  Get regular exercise. This is one of the most important things you can do for your health. Most adults should:  Exercise for at least 150 minutes each week. The exercise should increase your heart rate and make you sweat (moderate-intensity exercise).  Do strengthening exercises at least twice a week. This is in addition to the moderate-intensity exercise.  Spend less time sitting. Even light physical activity can be beneficial.  Watch cholesterol and blood lipids  Have your blood tested for lipids and cholesterol at 69 years of age, then have this test every 5 years.  You may need to have your cholesterol levels checked more often if:  Your lipid or cholesterol levels are high.  You are older than 69 years of age.  You are at high risk for heart disease.  What should I know about cancer screening?  Many types of cancers can be detected early and may often be prevented. Depending on your health history and family history, you may need to have cancer screening at various ages. This may include screening for:  Colorectal cancer.  Prostate cancer.  Skin cancer.  Lung  cancer.  What should I know about heart disease, diabetes, and high blood pressure?  Blood pressure and heart disease  High blood pressure causes heart disease and increases the risk of stroke. This is more likely to develop in people who have high blood pressure readings or are overweight.  Talk with your health care provider about your target blood pressure readings.  Have your blood pressure checked:  Every 3-5 years if you are 24-52 years of age.  Every year if you are 3 years old or older.  If you are between the ages of 60 and 72 and are a current or former smoker, ask your health care provider if you should have a one-time screening for abdominal aortic aneurysm (AAA).  Diabetes  Have regular diabetes screenings. This checks your fasting blood sugar level. Have the screening done:  Once every three years after age 66 if you are at a normal weight and have a low risk for diabetes.  More often and at a younger age if you are overweight or have a high risk for diabetes.  What should I know about preventing infection?  Hepatitis B  If you have a higher risk for hepatitis B, you should be screened for this virus. Talk with your health care provider to find out if you are at risk for hepatitis B infection.  Hepatitis C  Blood testing is recommended for:  Everyone born from [birth year] through 1966.  Anyone  with known risk factors for hepatitis C.  Sexually transmitted infections (STIs)  You should be screened each year for STIs, including gonorrhea and chlamydia, if:  You are sexually active and are younger than 69 years of age.  You are older than 69 years of age and your health care provider tells you that you are at risk for this type of infection.  Your sexual activity has changed since you were last screened, and you are at increased risk for chlamydia or gonorrhea. Ask your health care provider if you are at risk.  Ask your health care provider about whether you are at high risk for HIV. Your health care provider  may recommend a prescription medicine to help prevent HIV infection. If you choose to take medicine to prevent HIV, you should first get tested for HIV. You should then be tested every 3 months for as long as you are taking the medicine.  Follow these instructions at home:  Alcohol use  Do not drink alcohol if your health care provider tells you not to drink.  If you drink alcohol:  Limit how much you have to 0-2 drinks a day.  Know how much alcohol is in your drink. In the [place] one drink equals one 12 oz bottle of beer (355 mL), one 5 oz glass of wine (148 mL), or one 1 oz glass of hard liquor (44 mL).  Lifestyle  Do not use any products that contain nicotine or tobacco. These products include cigarettes, chewing tobacco, and vaping devices, such as e-cigarettes. If you need help quitting, ask your health care provider.  Do not use street drugs.  Do not share needles.  Ask your health care provider for help if you need support or information about quitting drugs.  General instructions  Schedule regular health, dental, and eye exams.  Stay current with your vaccines.  Tell your health care provider if:  You often feel depressed.  You have ever been abused or do not feel safe at home.  Summary  Adopting a healthy lifestyle and getting preventive care are important in promoting health and wellness.  Follow your health care provider's instructions about healthy diet, exercising, and getting tested or screened for diseases.  Follow your health care provider's instructions on monitoring your cholesterol and blood pressure.  This information is not intended to replace advice given to you by your health care provider. Make sure you discuss any questions you have with your health care provider.  Document Revised: 10/27/2020 Document Reviewed: 10/27/2020  Elsevier Patient Education  2024 [employer].

## 2024-06-05 NOTE — Progress Notes (Signed)
 Office Note 06/05/2024  CC:  Chief Complaint  Patient presents with   Annual Exam    Pt is fasting   Patient is a 68 y.o. male who is here for annual health maintenance exam and 26-month follow-up hypertension and hypercholesterolemia. A/P as of last visit: 1 hypertension, well-controlled per our office measurements on the irbesartan  150 mg a day. Home visit blood pressure last week 150/100. Encouraged him to get his blood pressure cuff out at home and check blood pressure regularly this week and call if consistently greater than 135/85. Electrolytes and creatinine today.   2.  Hypercholesterolemia, doing well on a atorvastatin  20 mg a day. He did have creamer in his coffee today. Check lipid panel today.  INTERIM HX: Feeling well. At follow-up for obstructive sleep apnea at the pulmonologist a few months ago they discussed starting Zepbound  in order to help his sleep apnea (he is on CPAP as well). He says the prescription was never sent in and he is frustrated and would like me to do the prescription when his insurance changes over in January.  He is working on diet and exercise as well.  Past Medical History:  Diagnosis Date   Diverticulitis 2005   recurrent->segmental colectomy   Erectile dysfunction    GERD (gastroesophageal reflux disease)    History of adenomatous polyp of colon    03/2021. Recall 2025   Hypercholesterolemia    04/2022 frmhm cv risk= 14%-->statin recommended   Hypertension    Mild dilation of ascending aorta 10/2021   7mm-->rpt 1 yr CTA or MRA   OSA (obstructive sleep apnea)     Past Surgical History:  Procedure Laterality Date   COLONOSCOPY     04/2007.  Rpt 2022-[facility]->polypectomy, recall 03/2024.   CORONARY CALCIUM  SCORE  10/2021   2023-->21st %'tile   Segmental colectomy  2008   d/t recurrent diverticulitis   TONSILLECTOMY AND ADENOIDECTOMY  2000   and uvulectomy    Family History  Problem Relation Age of Onset   Arthritis  Mother    Hearing loss Mother    Diabetes Mother    Cancer Father    Depression Father    Mental illness Father    Arthritis Sister    Alcohol abuse Maternal Grandmother    Diabetes Paternal Grandmother    Alcohol abuse Paternal Grandfather    Mental illness Paternal Grandfather    Alcohol abuse Daughter    Depression Daughter     Social History   Socioeconomic History   Marital status: Married    Spouse name: Not on file   Number of children: Not on file   Years of education: Not on file   Highest education level: Not on file  Occupational History   Not on file  Tobacco Use   Smoking status: Never   Smokeless tobacco: Never  Substance and Sexual Activity   Alcohol use: Yes    Comment: occasionly    Drug use: Not on file   Sexual activity: Not Currently  Other Topics Concern   Not on file  Social History Narrative   Married, 2 children.   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   No tob.   Alcohol-->hx of liquor but quit this, ongoing beer about a case per week as of 06/2021.   Social Drivers of Health   Tobacco Use: Low Risk (06/05/2024)   Patient History    Smoking Tobacco Use: Never    Smokeless Tobacco Use: Never    Passive Exposure:  Not on file  Financial Resource Strain: Low Risk (01/11/2024)   Overall Financial Resource Strain (CARDIA)    Difficulty of Paying Living Expenses: Not hard at all  Food Insecurity: No Food Insecurity (01/11/2024)   Epic    Worried About Programme Researcher, Broadcasting/film/video in the Last Year: Never true    Ran Out of Food in the Last Year: Never true  Transportation Needs: Unknown (01/11/2024)   Epic    Lack of Transportation (Medical): No    Lack of Transportation (Non-Medical): Not on file  Physical Activity: Inactive (01/11/2024)   Exercise Vital Sign    Days of Exercise per Week: 0 days    Minutes of Exercise per Session: 0 min  Stress: No Stress Concern Present (01/11/2024)   [employer] of Occupational Health - Occupational  Stress Questionnaire    Feeling of Stress: Not at all  Social Connections: Socially Integrated (01/11/2024)   Social Connection and Isolation Panel    Frequency of Communication with Friends and Family: More than three times a week    Frequency of Social Gatherings with Friends and Family: More than three times a week    Attends Religious Services: More than 4 times per year    Active Member of [employer] or Organizations: Yes    Attends Banker Meetings: More than 4 times per year    Marital Status: Married  Catering Manager Violence: Not At Risk (01/11/2024)   Epic    Fear of Current or Ex-Partner: No    Emotionally Abused: No    Physically Abused: No    Sexually Abused: No  Depression (PHQ2-9): Low Risk (06/05/2024)   Depression (PHQ2-9)    PHQ-2 Score: 3  Alcohol Screen: Not on file  Housing: Unknown (01/11/2024)   Epic    Unable to Pay for Housing in the Last Year: No    Number of Times Moved in the Last Year: Not on file    Homeless in the Last Year: No  Utilities: Not At Risk (01/11/2024)   Epic    Threatened with loss of utilities: No  Health Literacy: Adequate Health Literacy (01/11/2024)   B1300 Health Literacy    Frequency of need for help with medical instructions: Never    Outpatient Medications Prior to Visit  Medication Sig Dispense Refill   irbesartan  (AVAPRO ) 150 MG tablet TAKE 1 TABLET BY MOUTH EVERY DAY 90 tablet 1   tirzepatide  (ZEPBOUND ) 2.5 MG/0.5ML Pen Inject 2.5 mg into the skin once a week. (Patient not taking: Reported on 06/05/2024) 2 mL 1   atorvastatin  (LIPITOR) 20 MG tablet TAKE 1 TABLET BY MOUTH EVERY DAY 30 tablet 0   No facility-administered medications prior to visit.    Allergies[1]  Review of Systems  Constitutional:  Negative for appetite change, chills, fatigue and fever.  HENT:  Negative for congestion, dental problem, ear pain and sore throat.   Eyes:  Negative for discharge, redness and visual disturbance.  Respiratory:   Negative for cough, chest tightness, shortness of breath and wheezing.   Cardiovascular:  Negative for chest pain, palpitations and leg swelling.  Gastrointestinal:  Negative for abdominal pain, blood in stool, diarrhea, nausea and vomiting.  Genitourinary:  Negative for difficulty urinating, dysuria, flank pain, frequency, hematuria and urgency.  Musculoskeletal:  Negative for arthralgias, back pain, joint swelling, myalgias and neck stiffness.  Skin:  Negative for pallor and rash.  Neurological:  Negative for dizziness, speech difficulty, weakness and headaches.  Hematological:  Negative for adenopathy.  Does not bruise/bleed easily.  Psychiatric/Behavioral:  Negative for confusion and sleep disturbance. The patient is not nervous/anxious.     PE;    06/05/2024    8:07 AM 06/05/2024    7:56 AM 03/29/2024    8:50 AM  Vitals with BMI  Height  6' 0 6' 0  Weight  266 lbs 13 oz 262 lbs 13 oz  BMI  36.18 35.63  Systolic 130 143 867  Diastolic 80 81 84  Pulse  62 55     Gen: Alert, well appearing.  Patient is oriented to person, place, time, and situation. AFFECT: pleasant, lucid thought and speech. ENT: Ears: EACs clear, normal epithelium.  TMs with good light reflex and landmarks bilaterally.  Eyes: no injection, icteris, swelling, or exudate.  EOMI, PERRLA. Nose: no drainage or turbinate edema/swelling.  No injection or focal lesion.  Mouth: lips without lesion/swelling.  Oral mucosa pink and moist.  Dentition intact and without obvious caries or gingival swelling.  Oropharynx without erythema, exudate, or swelling.  Neck: supple/nontender.  No LAD, mass, or TM.  Carotid pulses 2+ bilaterally, without bruits. CV: RRR, no m/r/g.   LUNGS: CTA bilat, nonlabored resps, good aeration in all lung fields. ABD: soft, NT, ND, BS normal.  No hepatospenomegaly or mass.  No bruits. EXT: no clubbing, cyanosis, or edema.  Musculoskeletal: no joint swelling, erythema, warmth, or tenderness.  ROM  of all joints intact. Skin - no sores or suspicious lesions or rashes or color changes  Pertinent labs:  Lab Results  Component Value Date   TSH 2.52 05/24/2022   Lab Results  Component Value Date   WBC 5.8 06/01/2023   HGB 13.7 06/01/2023   HCT 41.9 06/01/2023   MCV 91.9 06/01/2023   PLT 286.0 06/01/2023   Lab Results  Component Value Date   CREATININE 0.70 11/07/2023   BUN 15 11/07/2023   NA 140 11/07/2023   K 3.9 11/07/2023   CL 103 11/07/2023   CO2 27 11/07/2023   Lab Results  Component Value Date   ALT 16 06/01/2023   AST 15 06/01/2023   ALKPHOS 62 06/01/2023   BILITOT 0.7 06/01/2023   Lab Results  Component Value Date   CHOL 146 11/07/2023   Lab Results  Component Value Date   HDL 53.70 11/07/2023   Lab Results  Component Value Date   LDLCALC 76 11/07/2023   Lab Results  Component Value Date   TRIG 81.0 11/07/2023   Lab Results  Component Value Date   CHOLHDL 3 11/07/2023   Lab Results  Component Value Date   PSA 2.28 06/01/2023   PSA 1.35 05/24/2022   PSA 2.10 06/29/2021   ASSESSMENT AND PLAN:   No problem-specific Assessment & Plan notes found for this encounter.  #1 health maintenance exam: Reviewed age and gender appropriate health maintenance issues (prudent diet, regular exercise, health risks of tobacco and excessive alcohol, use of seatbelts, fire alarms in home, use of sunscreen).  Also reviewed age and gender appropriate health screening as well as vaccine recommendations. Vaccines: Shingrix-> declined. Labs: fasting HP + PSA Prostate ca screening: PSA today Colon ca screening: recall as of 03/2024  2 hypertension, well-controlled per our office measurements on the irbesartan  150 mg a day. Electrolytes and creatinine today.   3.  Hypercholesterolemia, doing well on a atorvastatin  20 mg a day. Check lipid panel today.  4. Mild dilatation of ascending aorta on CT cardiac score imaging 10/2021-->discussed CTA to monitor--> ordered  today.  #  5 obesity/BMI 36 and obstructive sleep apnea not optimally controlled with CPAP. He will call our office in January next year and request Zepbound  prescription. Therapeutic expectations and side effect profile of medication discussed today.  Patient's questions answered.  An After Visit Summary was printed and given to the patient.  FOLLOW UP:  Return in about 6 months (around 12/04/2024) for routine chronic illness f/u.  Signed:  Gerlene Hockey, MD           06/05/2024     [1] No Known Allergies

## 2024-06-06 ENCOUNTER — Ambulatory Visit: Payer: Medicare (Managed Care)

## 2024-06-06 DIAGNOSIS — R7301 Impaired fasting glucose: Secondary | ICD-10-CM | POA: Diagnosis not present

## 2024-06-07 LAB — HEMOGLOBIN A1C
Hgb A1c MFr Bld: 6.4 % — ABNORMAL HIGH (ref <5.7)
Mean Plasma Glucose: 137 mg/dL
eAG (mmol/L): 7.6 mmol/L

## 2024-06-08 ENCOUNTER — Ambulatory Visit: Payer: Self-pay | Admitting: Family Medicine

## 2024-07-04 ENCOUNTER — Telehealth: Payer: Self-pay

## 2024-07-04 MED ORDER — ZEPBOUND 2.5 MG/0.5ML ~~LOC~~ SOAJ: 2.5000 mg | SUBCUTANEOUS | 1 refills | Status: DC

## 2024-07-04 NOTE — Telephone Encounter (Signed)
 Pt was here on 12/16 for physical, it was discussed that he would call our office in January to request Zepbound .  Please advise on rx

## 2024-07-04 NOTE — Telephone Encounter (Signed)
 Okay.  I sent the prescription for Zepbound  and wrote the diagnosis codes on it--> OSA on CPAP, G47.33. Also class II obesity, Z33.187.

## 2024-07-04 NOTE — Telephone Encounter (Signed)
 Tried calling patient, unable to LVM

## 2024-07-04 NOTE — Telephone Encounter (Signed)
 Copied from CRM [id number]. Topic: General - Other >> Jul 04, 2024  9:26 AM Eva FALCON wrote: Reason for CRM: Pt states in order to know if Zepbound  will be covered by insurance he is needing a procedure code. States [employer] should be calling in to ask for it but wants to be ahead of the game and wanted to know if the procedure code could be given to him and he could call the insurance and provide. Call back number [phone].

## 2024-07-05 NOTE — Telephone Encounter (Signed)
 MyChart message read by pt regarding medication and dx codes

## 2024-07-19 ENCOUNTER — Telehealth: Payer: Self-pay

## 2024-07-19 DIAGNOSIS — E66812 Obesity, class 2: Secondary | ICD-10-CM

## 2024-07-19 DIAGNOSIS — G4733 Obstructive sleep apnea (adult) (pediatric): Secondary | ICD-10-CM

## 2024-07-19 NOTE — Telephone Encounter (Signed)
 Source  Matthew Hess (Patient)   Subject  Matthew Hess (Patient)   Topic  Clinical - Prescription Issue    Communication  Reason for CRM: The patient's zepbound  costs too much and he is wondering what other alternatives he could try that [employer] would cover.      Please call the patient at [phone] to go over information and next steps. He is aware of the next day turn around time.    Spoke with pt, advised he would need to call and ask what medications are on his formulary.  The last call from 11/2023 states his insurance would cover Mounjaro  and Zepbound . He will follow up and let us  know of the update.

## 2024-07-25 MED ORDER — ZEPBOUND 2.5 MG/0.5ML ~~LOC~~ SOAJ: 2.5000 mg | SUBCUTANEOUS | 1 refills | Status: AC

## 2024-07-25 NOTE — Addendum Note (Signed)
 Addended by: FLETA CARE D on: 07/25/2024 10:51 AM   Modules accepted: Orders

## 2024-07-25 NOTE — Telephone Encounter (Signed)
" ° °  Spoke with pt and advised insurance has denied tier exception. Pt inquired about any other options, the only other option available is to send the prescription to [facility]. They should contact him within the next week     "

## 2024-07-26 MED ORDER — TIRZEPATIDE-WEIGHT MANAGEMENT 2.5 MG/0.5ML ~~LOC~~ SOLN: 2.5000 mg | SUBCUTANEOUS | 3 refills | Status: AC

## 2024-07-26 NOTE — Telephone Encounter (Signed)
 Primary Information  Source  Force (Other)   Subject  Havish, Petties (Patient)   Topic  Clinical - Prescription Issue    Communication  Reason for CRM: Tully from [facility] direct pharmacy is calling in stating that the medication tirzepatide  (ZEPBOUND ) 2.5 MG/0.5ML Pen is being rejected because they only fill viles and not pens at their pharmacy.

## 2024-07-26 NOTE — Addendum Note (Signed)
 Addended by: FLETA CARE D on: 07/26/2024 02:14 PM   Modules accepted: Orders

## 2024-12-05 ENCOUNTER — Ambulatory Visit: Payer: Medicare (Managed Care) | Admitting: Family Medicine
